# Patient Record
Sex: Male | Born: 1985 | Race: White | Hispanic: No | Marital: Married | Smoking: Never smoker
Health system: Southern US, Community
[De-identification: ages and names within clinical notes are randomized; demographics above are authoritative.]

## PROBLEM LIST (undated history)

## (undated) DIAGNOSIS — F419 Anxiety disorder, unspecified: Secondary | ICD-10-CM

## (undated) DIAGNOSIS — F4310 Post-traumatic stress disorder, unspecified: Secondary | ICD-10-CM

## (undated) HISTORY — PX: WISDOM TOOTH EXTRACTION: SHX21

---
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
## 2011-12-28 ENCOUNTER — Ambulatory Visit (INDEPENDENT_AMBULATORY_CARE_PROVIDER_SITE_OTHER): Payer: Private Health Insurance - Indemnity | Admitting: Family Medicine

## 2011-12-28 VITALS — BP 124/76 | HR 83 | Temp 98.0°F | Resp 17 | Ht 68.5 in | Wt 185.0 lb

## 2011-12-28 DIAGNOSIS — K644 Residual hemorrhoidal skin tags: Secondary | ICD-10-CM

## 2011-12-28 DIAGNOSIS — L501 Idiopathic urticaria: Secondary | ICD-10-CM

## 2011-12-28 DIAGNOSIS — L251 Unspecified contact dermatitis due to drugs in contact with skin: Secondary | ICD-10-CM

## 2011-12-28 DIAGNOSIS — L509 Urticaria, unspecified: Secondary | ICD-10-CM

## 2011-12-28 DIAGNOSIS — L259 Unspecified contact dermatitis, unspecified cause: Secondary | ICD-10-CM

## 2011-12-28 MED ORDER — HYDROCORTISONE 2.5 % RE CREA: TOPICAL_CREAM | Freq: Two times a day (BID) | RECTAL | Status: DC

## 2011-12-28 NOTE — Patient Instructions (Addendum)
Your rash sounds like hives (urticaria), which may be due to contact with some chemical or substance that is causing an allergy. It is less likely due to scabies recurrence. Take zyrtec over the counter - 1 per day, and benadryl once at night. Ok to use cortisone or desonide cream twice per day to affected areas if still itching.  Switch to dye free/hypoallergenic detergent and fabric softener if you use this.  If not improving in next few days, can retreat with the permethrin cream (head to toe - rinse off in 8 hours and wash bedclothes in hot water). If still not improved, we may need to use prednisone, but recheck with a picture of this rash if not improving.  Return to the clinic or go to the nearest emergency room if any of your symptoms worsen or new symptoms occur.

## 2011-12-28 NOTE — Progress Notes (Signed)
  Subjective:    Patient ID: Ryan Hays, male    DOB: [birth year], 26 y.o.   MRN: [id number]  HPI Ryan Hays is a 27 y.o. male Started with rash around waist and upper legs 3 weeks ago, then to back this week.  Few areas on arms, 1 area between fingers.  Itchy and more swollen and noticeable at night. Itch is main symptom.  No new detergents or dermatologic products. Flairs up more after shower - looks like hives.   Dove soap.  New detergent - 1 week ago, usually uses gain.    Wife had scabies about a month or so ago, and he also used cream overnight to be careful.  Desonide, otc hydrocortisone - past 2 weeks, new   Also with hemorrhoid - used rx cream prior but running out. On and off flairs - current one past few days.  Review of Systems  Constitutional: Negative for fever and chills.  Respiratory: Negative for cough, chest tightness and shortness of breath.   Skin: Positive for rash.       Objective:   Physical Exam  Vitals reviewed. Constitutional: He is oriented to person, place, and time. He appears well-developed and well-nourished.  Cardiovascular: Normal rate, regular rhythm, normal heart sounds and intact distal pulses.   Pulmonary/Chest: Effort normal and breath sounds normal. No stridor.  Genitourinary: Rectal exam shows external hemorrhoid.     Neurological: He is alert and oriented to person, place, and time.  Skin: Skin is warm, dry and intact. Rash noted. Rash is urticarial (faint area on R thigh, L forearm, no lesions noted on back.).          1 small erythematous area interdigital on R hand.   Psychiatric: He has a normal mood and affect. His behavior is normal.       Assessment & Plan:  Ryan Hays is a 26 y.o. male 1. Hemorrhoids, external  hydrocortisone (ANUSOL-HC) 2.5 % rectal cream  2. Urticaria    3. Contact dermatitis     Suspected contact derm/uticaria vs recurrence of scabies. Minimal rash currently, but hx of worsening overnight and  after shower typical of urticaria. Held on prednisone based on reported allergy, but unlikely allergic with clarification of history.  See below. rtc precautions.  Hemorrhoid - nonthrombosed. anusol hc bid prn.  Discussed preventive measures - fluids, fiber, etc, and rtc precautions.   Patient Instructions  Your rash sounds like hives (urticaria), which may be due to contact with some chemical or substance that is causing an allergy. It is less likely due to scabies recurrence. Take zyrtec over the counter - 1 per day, and benadryl once at night. Ok to use cortisone or desonide cream twice per day to affected areas if still itching.  Switch to dye free/hypoallergenic detergent and fabric softener if you use this.  If not improving in next few days, can retreat with the permethrin cream (head to toe - rinse off in 8 hours and wash bedclothes in hot water). If still not improved, we may need to use prednisone, but recheck with a picture of this rash if not improving.  Return to the clinic or go to the nearest emergency room if any of your symptoms worsen or new symptoms occur.

## 2012-01-15 ENCOUNTER — Ambulatory Visit (INDEPENDENT_AMBULATORY_CARE_PROVIDER_SITE_OTHER): Payer: Private Health Insurance - Indemnity | Admitting: Family Medicine

## 2012-01-15 VITALS — BP 116/71 | HR 76 | Temp 97.8°F | Resp 18 | Ht 68.5 in | Wt 188.4 lb

## 2012-01-15 DIAGNOSIS — R21 Rash and other nonspecific skin eruption: Secondary | ICD-10-CM

## 2012-01-15 DIAGNOSIS — Z131 Encounter for screening for diabetes mellitus: Secondary | ICD-10-CM

## 2012-01-15 DIAGNOSIS — L299 Pruritus, unspecified: Secondary | ICD-10-CM

## 2012-01-15 LAB — POCT GLYCOSYLATED HEMOGLOBIN (HGB A1C): Hemoglobin A1C: 5.4

## 2012-01-15 MED ORDER — HYDROXYZINE HCL 25 MG PO TABS: 25.0000 mg | ORAL_TABLET | Freq: Every evening | ORAL | Status: DC | PRN

## 2012-01-15 MED ORDER — CLOBETASOL PROPIONATE 0.05 % EX CREA: TOPICAL_CREAM | Freq: Two times a day (BID) | CUTANEOUS | Status: DC

## 2012-01-15 MED ORDER — NYSTATIN-TRIAMCINOLONE 100000-0.1 UNIT/GM-% EX CREA: TOPICAL_CREAM | Freq: Four times a day (QID) | CUTANEOUS | Status: DC

## 2012-01-15 NOTE — Patient Instructions (Signed)
Pruritus  Pruritis is an itch. There are many different problems that can cause an itch. Dry skin is one of the most common causes of itching. Most cases of itching do not require medical attention.  HOME CARE INSTRUCTIONS  Make sure your skin is moistened on a regular basis. A moisturizer that contains petroleum jelly is best for keeping moisture in your skin. If you develop a rash, you may try the following for relief:   Use corticosteroid cream.  Apply cool compresses to the affected areas.  Bathe with Epsom salts or baking soda in the bathwater.  Soak in colloidal oatmeal baths. These are available at your pharmacy.  Apply baking soda paste to the rash. Stir water into baking soda until it reaches a paste-like consistency.  Use an anti-itch lotion.  Take over-the-counter diphenhydramine medicine by mouth as the instructions direct.  Avoid scratching. Scratching may cause the rash to become infected. If itching is very bad, your caregiver may suggest prescription lotions or creams to lessen your symptoms.  Avoid hot showers, which can make itching worse. A cold shower may help with itching as long as you use a moisturizer after the shower. SEEK MEDICAL CARE IF: The itching does not go away after several days. Document Released: 11/16/2010 Document Revised: 05/29/2011 Document Reviewed: 11/16/2010 [employer] Patient Information 2013 [employer].  

## 2012-01-15 NOTE — Progress Notes (Signed)
[facility]:  Office Visit  Chief Complaint:  Chief Complaint  Patient presents with  . Rash    All over body . Started in groin area in September.    HPI: Ryan Hays is a 26 y.o. male who complains of rash around groin (started on groin and now on waste line) and is on inferior part of axilla and also on the flexor area of his arms at elbow jt. Has been here before for rash , he was seen because wife had scabies and he was treated for it as well, then he developed this rash, Dr.Green had asked him to follow specific instructions regarding antihistamine use and also steroid cream, he has not followed directions. The rash per patient itches all day long and he can't sleep..Denies nay new meds, foods, allergies, travels, detergents, soaps. Works at [employer] job. No pets at home. The only thing different is that her Moved in August to a new apartment, all hardwood. Wife was dx with scabies from nursing home in Late August/early Sept, both she and he have been treated with Permetherin. They used the Permetherin and left it on for 20 hrs x 2 doses. The rash started about the same tie after he had the first dose of Permetherin.  Has diabetes in his family. His son has eczema  History reviewed. No pertinent past medical history. History reviewed. No pertinent past surgical history. History   Social History  . Marital Status: Married    Spouse Name: N/A    Number of Children: N/A  . Years of Education: N/A   Social History Main Topics  . Smoking status: Never Smoker   . Smokeless tobacco: None  . Alcohol Use: None  . Drug Use: None  . Sexually Active: None   Other Topics Concern  . None   Social History Narrative  . None   No family history on file. Allergies  Allergen Reactions  . Prednisone Rash    Reviewed - at 26yo - had poison ivy - rash got worse.    Prior to Admission medications   Medication Sig Start Date End Date Taking? Authorizing Provider    clotrimazole (LOTRIMIN) 1 % cream Apply topically 2 (two) times daily.   Yes Historical Provider, MD  hydrocortisone 1 % lotion Apply topically 2 (two) times daily. Buys the OTC Eczema lotion.   Yes Historical Provider, MD  hydrocortisone (ANUSOL-HC) 2.5 % rectal cream Place rectally 2 (two) times daily. 12/28/11   Shade Flood, MD  hydrocortisone cream 1 % Apply topically 2 (two) times daily.    Historical Provider, MD     ROS: The patient denies fevers, chills, night sweats, unintentional weight loss, chest pain, palpitations, wheezing, dyspnea on exertion, nausea, vomiting, abdominal pain, dysuria, hematuria, melena, numbness, weakness, or tingling. All other systems have been reviewed and were otherwise negative with the exception of those mentioned in the HPI and as above.    PHYSICAL EXAM: Filed Vitals:   01/15/12 0854  BP: 116/71  Pulse: 76  Temp: 97.8 F (36.6 C)  Resp: 18   Filed Vitals:   01/15/12 0854  Height: 5' 8.5" (1.74 m)  Weight: 188 lb 6.4 oz (85.458 kg)   Body mass index is 28.23 kg/(m^2).  General: Alert, no acute distress HEENT:  Normocephalic, atraumatic, oropharynx patent.  Cardiovascular:  Regular rate and rhythm, no rubs murmurs or gallops.  No Carotid bruits, radial pulse intact. No pedal edema.  Respiratory: Clear to  auscultation bilaterally.  No wheezes, rales, or rhonchi.  No cyanosis, no use of accessory musculature GI: No organomegaly, abdomen is soft and non-tender, positive bowel sounds.  No masses. Skin:+ small diffuse erythematous papular rash in patches all over body, on flexor areas of arms, on groin area, on stomach, waistline; none along hands or webspaces Neurologic: Facial musculature symmetric. Psychiatric: Patient is appropriate throughout our interaction. Lymphatic: No cervical lymphadenopathy Musculoskeletal: Gait intact.   LABS: Results for orders placed in visit on 01/15/12  POCT GLYCOSYLATED HEMOGLOBIN (HGB A1C)       Component Value Range   Hemoglobin A1C 5.4       EKG/XRAY:   Primary read interpreted by Dr. Conley Rolls at [facility].   ASSESSMENT/PLAN: Encounter Diagnoses  Name Primary?  . Rash and nonspecific skin eruption Yes  . Screening for diabetes mellitus   . Itch    ? Reaction to Permetherin vs atopic dermatitis Rx Clobetasol for lower body rash Rx Mycolog for groin rash Rx Vistaril for itching take qhs and then Zyrtec in the AM. F/u in 2 weeks, if no improvement then will either refer to dermatology or do skin biopsy. Patient preference.    Ryan Blacklock PHUONG, DO 01/15/2012 10:41 AM

## 2012-04-26 ENCOUNTER — Encounter (HOSPITAL_BASED_OUTPATIENT_CLINIC_OR_DEPARTMENT_OTHER): Payer: Self-pay | Admitting: *Deleted

## 2012-04-26 ENCOUNTER — Other Ambulatory Visit: Payer: Self-pay | Admitting: Emergency Medicine

## 2012-04-26 ENCOUNTER — Emergency Department (HOSPITAL_BASED_OUTPATIENT_CLINIC_OR_DEPARTMENT_OTHER)
Admission: EM | Admit: 2012-04-26 | Discharge: 2012-04-26 | Disposition: A | Discharge status: Home / Self Care | Payer: Managed Care, Other (non HMO) | Attending: Emergency Medicine | Admitting: Emergency Medicine

## 2012-04-26 ENCOUNTER — Emergency Department (HOSPITAL_BASED_OUTPATIENT_CLINIC_OR_DEPARTMENT_OTHER): Payer: Managed Care, Other (non HMO)

## 2012-04-26 ENCOUNTER — Ambulatory Visit (INDEPENDENT_AMBULATORY_CARE_PROVIDER_SITE_OTHER): Payer: Private Health Insurance - Indemnity | Admitting: Emergency Medicine

## 2012-04-26 ENCOUNTER — Ambulatory Visit: Payer: Private Health Insurance - Indemnity

## 2012-04-26 VITALS — BP 120/84 | HR 101 | Temp 98.0°F | Resp 16 | Ht 68.0 in | Wt 190.8 lb

## 2012-04-26 DIAGNOSIS — R079 Chest pain, unspecified: Secondary | ICD-10-CM

## 2012-04-26 DIAGNOSIS — R0789 Other chest pain: Secondary | ICD-10-CM

## 2012-04-26 DIAGNOSIS — R071 Chest pain on breathing: Secondary | ICD-10-CM | POA: Insufficient documentation

## 2012-04-26 LAB — POCT CBC
MCH, POC: 30.4 pg (ref 27–31.2)
MID (cbc): 0.9 (ref 0–0.9)
MPV: 9.2 fL (ref 0–99.8)
POC MID %: 9.1 %M (ref 0–12)
Platelet Count, POC: 248 10*3/uL (ref 142–424)
RBC: 4.51 M/uL — AB (ref 4.69–6.13)
WBC: 9.9 10*3/uL (ref 4.6–10.2)

## 2012-04-26 MED ORDER — OXYCODONE-ACETAMINOPHEN 5-325 MG PO TABS: 1.0000 | ORAL_TABLET | ORAL | Status: DC | PRN

## 2012-04-26 MED ORDER — IOHEXOL 350 MG/ML SOLN
100.0000 mL | Freq: Once | INTRAVENOUS | Status: AC | PRN
  Administered 2012-04-26: 100 mL via INTRAVENOUS

## 2012-04-26 MED ORDER — IBUPROFEN 800 MG PO TABS: 800.0000 mg | ORAL_TABLET | Freq: Three times a day (TID) | ORAL | Status: DC

## 2012-04-26 MED ORDER — OXYCODONE-ACETAMINOPHEN 5-325 MG PO TABS
1.0000 | ORAL_TABLET | Freq: Once | ORAL | Status: AC
  Administered 2012-04-26: 1 via ORAL
  Filled 2012-04-26 (×2): qty 1

## 2012-04-26 MED ORDER — KETOROLAC TROMETHAMINE 60 MG/2ML IM SOLN
60.0000 mg | Freq: Once | INTRAMUSCULAR | Status: AC
  Administered 2012-04-26: 60 mg via INTRAMUSCULAR

## 2012-04-26 NOTE — Progress Notes (Signed)
  Subjective:    Patient ID: Ryan Hays, male    DOB: [birth year], 27 y.o.   MRN: [id number]  HPI Patient presents today with a sharp pain in his right side. Patient is guarding right side. Complains of a 7 out of 10 pain scale. Pain began yesterday morning when he woke up. States that he thought he might have slept wrong. The pain has been consistent and getting worse through out the day. He was unable to get much sleep last night. He has also been short of breath. When he takes a breath in it causes the pain to increase. He states no injury that he is aware of. He has not been ill or any cough. No pain in legs. Denies any travel. No swelling or rash. Patient is an Film/video editor.    Review of Systems     Objective:   Physical Exam he was sitting in the room leaning to the side to try and get comfortable due to his chest pain. He has obvious discomfort when he tries to take a deep breath. Breath sounds posterior were symmetrical without rub. When I listened anteriorly I did not hear good breath sounds but he was having difficulty taking a deep breath. Neck exam is unremarkable. Abdomen was soft in the right upper quadrant. There is no definite chest wall tenderness.  [facility] reading (PRIMARY) by  Dr. Cleta Alberts there is poor inspiratory effort. Please comment on the right cardiophrenic angle.  Results for orders placed in visit on 01/15/12  POCT GLYCOSYLATED HEMOGLOBIN (HGB A1C)      Component Value Range   Hemoglobin A1C 5.4     Results for orders placed in visit on 04/26/12  POCT CBC      Component Value Range   WBC 9.9  4.6 - 10.2 K/uL   Lymph, poc 3.0  0.6 - 3.4   POC LYMPH PERCENT 30.1  10 - 50 %L   MID (cbc) 0.9  0 - 0.9   POC MID % 9.1  0 - 12 %M   POC Granulocyte 6.0  2 - 6.9   Granulocyte percent 60.8  37 - 80 %G   RBC 4.51 (*) 4.69 - 6.13 M/uL   Hemoglobin 13.7 (*) 14.1 - 18.1 g/dL   HCT, POC 46.9 (*) 62.9 - 53.7 %   MCV 89.6  80 - 97 fL   MCH, POC 30.4  27 - 31.2 pg   MCHC 33.9   31.8 - 35.4 g/dL   RDW, POC 52.8     Platelet Count, POC 248  142 - 424 K/uL   MPV 9.2  0 - 99.8 fL       Assessment & Plan:  Patient presents with right pleuritic chest pain. Chest x-ray shows no pneumothorax. He was given a shot of Toradol but will need scanning to the hospital. We'll discuss with the patient whether he wants checked into the emergency room which I think would be better patient having any outpatient CT angiogram to rule out PE. 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

## 2012-04-26 NOTE — ED Notes (Signed)
Awakened with right sided rib pain yesterday.  Pain progressively worsened throughout the day.  Today, he c/o increased pain and pain with inspiration. Denies recent fall or injury.  Went to UC this am and they sent pt. here for possible CT Chest.

## 2012-04-26 NOTE — ED Notes (Signed)
Pt reports sharp pain right ribs since waking yesterday am- denies injury- seen at [facility] today and was given IM toradol which has helped- sent here for possible CT scan due to ?cxray results at Urgent Care

## 2012-04-26 NOTE — ED Provider Notes (Signed)
History     CSN: [id number]  Arrival date & time 04/26/12  1039   First MD Initiated Contact with Patient 04/26/12 1151      Chief Complaint  Patient presents with  . Chest Pain    (Consider location/radiation/quality/duration/timing/severity/associated sxs/prior treatment) HPI Comments: Pt presents today for right sided anterior rib pain x 2 days.  States he woke up and felt as though he slept wrong.  Pain is described as constant, sharp, non-radiating, and worse with movement or deep breathing.  Was previously seen at urgent care today where CXR revealed no acute processes. Pt was sent here for further evaluation.  Denies any recent chest or LE injury.  Denies fever, cough, abdominal pain, nausea, vomiting, diarrhea, or LE edema.  No prior episodes of chest pain like this.  No hx of DVT, PE, renal stones, or gallstones.  Pt is not a smoker. Pain does not seem to be related to eating.  Positive family hx for MI.  Pt does not feel SOB at this time.  The history is provided by the patient.    History reviewed. No pertinent past medical history.  History reviewed. No pertinent past surgical history.  No family history on file.  History  Substance Use Topics  . Smoking status: Never Smoker   . Smokeless tobacco: Never Used  . Alcohol Use: 0.6 oz/week    1 Cans of beer per week      Review of Systems  Cardiovascular: Positive for chest pain (right sided).  All other systems reviewed and are negative.    Allergies  Prednisone  Home Medications   Current Outpatient Rx  Name  Route  Sig  Dispense  Refill  . IBUPROFEN 200 MG PO TABS   Oral   Take 400 mg by mouth every 6 (six) hours as needed.         Marland Kitchen CLOBETASOL PROPIONATE 0.05 % EX CREA   Topical   Apply topically 2 (two) times daily. Apply only for 2 weeks at a time   60 g   2   . CLOTRIMAZOLE 1 % EX CREA   Topical   Apply topically 2 (two) times daily.         Marland Kitchen HYDROCORTISONE 2.5 % RE CREA   Rectal  Place rectally 2 (two) times daily.   30 g   0   . HYDROCORTISONE 1 % EX LOTN   Topical   Apply topically 2 (two) times daily. Buys the OTC Eczema lotion.         Marland Kitchen HYDROCORTISONE 1 % EX CREA   Topical   Apply topically 2 (two) times daily.         Marland Kitchen HYDROXYZINE HCL 25 MG PO TABS   Oral   Take 1 tablet (25 mg total) by mouth at bedtime as needed for itching.   30 tablet   0   . NYSTATIN-TRIAMCINOLONE 100000-0.1 UNIT/GM-% EX CREA   Topical   Apply topically 4 (four) times daily. Apply for 2 weeks at a time to groin only   60 g   2     BP 136/92  Pulse 105  Temp 98.4 F (36.9 C) (Oral)  Resp 24  Ht 5\' 10"  (1.778 m)  Wt 190 lb (86.183 kg)  BMI 27.26 kg/m2  SpO2 98%  Physical Exam  Nursing note and vitals reviewed. Constitutional: He is oriented to person, place, and time. He appears well-developed and well-nourished.  HENT:  Head: Normocephalic and atraumatic.  Eyes: Conjunctivae normal and EOM are normal.  Neck: Normal range of motion.  Cardiovascular: Normal rate, regular rhythm and normal heart sounds.   No murmur heard. Pulmonary/Chest: Effort normal and breath sounds normal. No respiratory distress. He has no wheezes. He has no rhonchi. He exhibits tenderness (right anterior ribs). He exhibits no laceration, no deformity and no swelling.  Abdominal: Soft. Bowel sounds are normal. There is no tenderness. There is no CVA tenderness, no tenderness at McBurney's point and negative Murphy's sign.  Musculoskeletal: Normal range of motion. He exhibits no edema.  Neurological: He is alert and oriented to person, place, and time.  Skin: Skin is warm and dry. He is not diaphoretic.  Psychiatric: He has a normal mood and affect.    ED Course  Procedures (including critical care time)  Labs Reviewed - No data to display Dg Chest 2 View  04/26/2012  *RADIOLOGY REPORT*  Clinical Data: History of chest pain.  CHEST - 2 VIEW  Comparison: None.  Findings: Cardiac  silhouette is upper normal size and shape. Density in right epicardial region is felt to most likely represent epicardial fat pad. No definite abnormal opacity seen on lateral examination.  No pulmonary infiltrates or masses were evident. No pleural abnormality is evident. Bones appear average for age.  IMPRESSION: No acute  cardiopulmonary process is evident.  Density in right epicardial region is felt to most likely represent epicardial fat pad. I do not have prior studies for comparison.  CT of the chest could be considered to exclude other possible etiology of this density.   Original Report Authenticated By: Onalee Hua Call    Ct Angio Chest Pe W/cm &/or Wo Cm  04/26/2012  *RADIOLOGY REPORT*  Clinical Data: Lambert Mody right chest pain.  Pain with deep inspiration.  CT ANGIOGRAPHY CHEST  Technique:  Multidetector CT imaging of the chest using the standard protocol during bolus administration of intravenous contrast. Multiplanar reconstructed images including MIPs were obtained and reviewed to evaluate the vascular anatomy.  Contrast: OMNIPAQUE IOHEXOL 350 MG/ML SOLN  Comparison: Chest x-ray dated 04/26/2012  Findings: Pulmonary arterial opacification is suboptimal but I think that the study is diagnostic.  There are no pulmonary emboli, infiltrates, effusions, or other significant abnormalities.  The osseous structures are normal. Heart size is normal.  IMPRESSION: Normal exam.   Original Report Authenticated By: Francene Boyers, M.D.      1. Right-sided chest wall pain       MDM  12:10 PM Pt evaluated.  CXR as noted above.  Recommended CT angio to r/o PE.  Will re-evaluate once resulted.   2:38 PM CT angio chest unremarkable.  Pain is most likely musculoskeletal in nature given rib tenderness and negative Murphy's sign.  Will give pain meds and anti-inflammatories.  Information given about gallbladder/gallstones and encouraged to follow up if begin having these symptoms.  Return to the ED for new or  worsening symptoms.  Garlon Hatchet, PA-C 04/26/12 [phone]

## 2012-04-29 NOTE — ED Provider Notes (Signed)
Medical screening examination/treatment/procedure(s) were performed by non-physician practitioner and as supervising physician I was immediately available for consultation/collaboration.  Ethelda Chick, MD 04/29/12 [phone]

## 2012-04-30 LAB — ANTI-NUCLEAR AB-TITER (ANA TITER): ANA Titer 1: NEGATIVE

## 2012-05-13 ENCOUNTER — Telehealth: Payer: Self-pay

## 2012-05-13 NOTE — Telephone Encounter (Signed)
Pt called [place] back about his labs. Notified.

## 2013-03-04 ENCOUNTER — Telehealth: Payer: Self-pay

## 2013-03-04 ENCOUNTER — Ambulatory Visit (INDEPENDENT_AMBULATORY_CARE_PROVIDER_SITE_OTHER): Payer: BC Managed Care – PPO | Admitting: Family Medicine

## 2013-03-04 ENCOUNTER — Ambulatory Visit: Payer: BC Managed Care – PPO

## 2013-03-04 VITALS — BP 116/82 | HR 124 | Temp 98.3°F | Resp 18 | Ht 69.0 in | Wt 191.0 lb

## 2013-03-04 DIAGNOSIS — R062 Wheezing: Secondary | ICD-10-CM

## 2013-03-04 DIAGNOSIS — J45909 Unspecified asthma, uncomplicated: Secondary | ICD-10-CM

## 2013-03-04 DIAGNOSIS — R05 Cough: Secondary | ICD-10-CM

## 2013-03-04 LAB — POCT CBC
HCT, POC: 48.2 % (ref 43.5–53.7)
Hemoglobin: 15.1 g/dL (ref 14.1–18.1)
Lymph, poc: 3.9 — AB (ref 0.6–3.4)
MCH, POC: 29.1 pg (ref 27–31.2)
MCHC: 31.3 g/dL — AB (ref 31.8–35.4)
WBC: 8.9 10*3/uL (ref 4.6–10.2)

## 2013-03-04 MED ORDER — HYDROCOD POLST-CHLORPHEN POLST 10-8 MG/5ML PO LQCR: 5.0000 mL | Freq: Two times a day (BID) | ORAL | Status: AC

## 2013-03-04 MED ORDER — AZITHROMYCIN 250 MG PO TABS: ORAL_TABLET | ORAL | Status: AC

## 2013-03-04 MED ORDER — MUCINEX DM MAXIMUM STRENGTH 60-1200 MG PO TB12: 1.0000 | ORAL_TABLET | Freq: Two times a day (BID) | ORAL | Status: DC

## 2013-03-04 MED ORDER — ALBUTEROL SULFATE (2.5 MG/3ML) 0.083% IN NEBU
2.5000 mg | INHALATION_SOLUTION | Freq: Once | RESPIRATORY_TRACT | Status: AC
  Administered 2013-03-04: 2.5 mg via RESPIRATORY_TRACT

## 2013-03-04 NOTE — Telephone Encounter (Signed)
What are the questions? Called him he wants to know if he is contagious advised him he was likely contagious at the beginning of his illness. He should use good hand washing techniques

## 2013-03-04 NOTE — Progress Notes (Signed)
Subjective:    Patient ID: Ryan Hays, male    DOB: 16-Nov-1985, 27 y.o.   MRN: [id number]  HPI Pt presents to clinic with 3 week h/o cold symptoms.  He started with congestion that after about a week seemed to get better but then after several days he seemed to get same symptoms back with more chest congestion now.  He has yellow rhinorrhea that is mild and a productive cough with yellow sputum that seems to get thicker as the days goes on.  He does not feel SOB but has heard himself wheezing (he has no h/o asthma but his son does have asthma).    OTC meds -aleve, mucinex (last several days) Review of Systems  Constitutional: Negative for fever and chills.  HENT: Positive for congestion, postnasal drip, rhinorrhea (yellow) and sore throat.   Respiratory: Positive for cough (yellow).   Gastrointestinal: Negative for nausea (at onset up resolved now), vomiting and diarrhea.  Musculoskeletal: Negative for myalgias.  Neurological: Negative for headaches.       Objective:   Physical Exam  Vitals reviewed. Constitutional: He is oriented to person, place, and time. He appears well-developed and well-nourished.  HENT:  Head: Normocephalic and atraumatic.  Right Ear: Hearing, tympanic membrane, external ear and ear canal normal.  Left Ear: Hearing, tympanic membrane, external ear and ear canal normal.  Nose: Mucosal edema (red) present.  Mouth/Throat: Uvula is midline, oropharynx is clear and moist and mucous membranes are normal.  Eyes: Conjunctivae are normal.  Neck: Normal range of motion.  Cardiovascular: Normal rate, regular rhythm and normal heart sounds.   No murmur heard. Pulmonary/Chest: Effort normal. He has wheezes (expiratory wheezing/rhonchi on the L lung and the R lung base - ).  Lymphadenopathy:    He has no cervical adenopathy.  Neurological: He is alert and oriented to person, place, and time.  Skin: Skin is warm and dry.  Psychiatric: He has a normal mood and  affect. His behavior is normal. Judgment and thought content normal.   Pt given an albuterol neb and he felt no better - his pulse Ox went to 98% and his HR increased to 145 and he felt jittery - still had the burning in his chest - there was no change in his auscultatory lung exam  Results for orders placed in visit on 03/04/13  POCT CBC      Result Value Range   WBC 8.9  4.6 - 10.2 K/uL   Lymph, poc 3.9 (*) 0.6 - 3.4   POC LYMPH PERCENT 44.1  10 - 50 %L   MID (cbc) 0.6  0 - 0.9   POC MID % 7.3  0 - 12 %M   POC Granulocyte 4.3  2 - 6.9   Granulocyte percent 48.6  37 - 80 %G   RBC 5.19  4.69 - 6.13 M/uL   Hemoglobin 15.1  14.1 - 18.1 g/dL   HCT, POC 40.9  81.1 - 53.7 %   MCV 92.8  80 - 97 fL   MCH, POC 29.1  27 - 31.2 pg   MCHC 31.3 (*) 31.8 - 35.4 g/dL   RDW, POC 91.4     Platelet Count, POC 262  142 - 424 K/uL   MPV 9.4  0 - 99.8 fL   [facility] reading (PRIMARY) by  Dr. Katrinka Blazing.  No infiltrate.      Assessment & Plan:  Wheezing - Plan: albuterol (PROVENTIL) (2.5 MG/3ML) 0.083% nebulizer solution 2.5 mg, DG  Chest 2 View  Cough - Plan: DG Chest 2 View, POCT CBC, Dextromethorphan-Guaifenesin (MUCINEX DM MAXIMUM STRENGTH) 60-1200 MG TB12, chlorpheniramine-HYDROcodone (TUSSIONEX PENNKINETIC ER) 10-8 MG/5ML LQCR  Asthmatic bronchitis - Plan: azithromycin (ZITHROMAX Z-PAK) 250 MG tablet Acute bronchitis that was not respondent to albuterol - will treat with abx and mucinex and cough meds -  Push fluids - RTC in 48h if no better sooner if worse.  Benny Lennert PA-C 03/04/2013 10:13 AM

## 2013-03-04 NOTE — Telephone Encounter (Signed)
PT STATES HE WAS IN EARLIER AND SAW SARAH WEBER, NOW HAVE A COUPLE OF QUESTIONS FOR HER. PLEASE CALL [phone]

## 2013-03-22 ENCOUNTER — Ambulatory Visit (INDEPENDENT_AMBULATORY_CARE_PROVIDER_SITE_OTHER): Payer: BC Managed Care – PPO | Admitting: Family Medicine

## 2013-03-22 ENCOUNTER — Telehealth: Payer: Self-pay | Admitting: Family Medicine

## 2013-03-22 VITALS — BP 140/90 | HR 82 | Temp 98.0°F | Resp 18 | Ht 68.5 in | Wt 191.0 lb

## 2013-03-22 DIAGNOSIS — R05 Cough: Secondary | ICD-10-CM

## 2013-03-22 DIAGNOSIS — K649 Unspecified hemorrhoids: Secondary | ICD-10-CM

## 2013-03-22 DIAGNOSIS — R059 Cough, unspecified: Secondary | ICD-10-CM

## 2013-03-22 DIAGNOSIS — J4520 Mild intermittent asthma, uncomplicated: Secondary | ICD-10-CM

## 2013-03-22 DIAGNOSIS — R058 Other specified cough: Secondary | ICD-10-CM

## 2013-03-22 DIAGNOSIS — J45909 Unspecified asthma, uncomplicated: Secondary | ICD-10-CM

## 2013-03-22 MED ORDER — PREDNISONE 20 MG PO TABS: ORAL_TABLET | ORAL | Status: DC

## 2013-03-22 MED ORDER — HYDROCORTISONE 2.5 % RE CREA: 1.0000 "application " | TOPICAL_CREAM | Freq: Two times a day (BID) | RECTAL | Status: DC

## 2013-03-22 MED ORDER — CEFDINIR 300 MG PO CAPS: 600.0000 mg | ORAL_CAPSULE | Freq: Every day | ORAL | Status: DC

## 2013-03-22 MED ORDER — ALBUTEROL SULFATE HFA 108 (90 BASE) MCG/ACT IN AERS: INHALATION_SPRAY | RESPIRATORY_TRACT | Status: AC

## 2013-03-22 NOTE — Telephone Encounter (Signed)
Pt returned to clinic with an old rx for anusol HC cream from 2013 per Dr. Neva SeatGreene.  He states he never filled this rx but would like to know if he can still use it for hemorrhoids.  He states he has itching but no bleeding.  I will write a new rx for him today and send to drug store for him.

## 2013-03-22 NOTE — Patient Instructions (Signed)
Drink plenty of fluids  Use the inhaler 2 puffs about 4 times daily initially for a few days, then decrease to just using it as needed  Take the prednisone 3 daily for 2 days, then 2 daily for 2 days, then one daily for 2 days. Best taken in the morning.  [facility] one twice daily  Return again if not improving

## 2013-03-22 NOTE — Progress Notes (Signed)
Subjective: 28 year old man who was treated here for a persisting cough asthmatic bronchitis several weeks ago. Chest x-ray and labs were normal. He was treated with a Z-Pak and has continued to have his symptoms. He coughs a lot, especially in the evenings. He does not smoke. His son has some asthma and cough some. His wife has not been ill at all. 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 He coughs up a little bit of yellowish mucus.  This all started with a cold back around Thanksgiving with his sinuses and head plugged up. No fever.  Objective: Pleasant alert gentleman in no major distress. His TMs are normal. Throat clear. Neck supple without significant nodes. Chest clear to percussion and auscultation. Heart regular without murmurs.  Peak flow 540 with a predicted maximum of 625  Assessment: Asthmatic bronchitis Post viral cough syndrome

## 2013-06-17 ENCOUNTER — Ambulatory Visit (INDEPENDENT_AMBULATORY_CARE_PROVIDER_SITE_OTHER): Payer: BC Managed Care – PPO | Admitting: Internal Medicine

## 2013-06-17 ENCOUNTER — Encounter: Payer: Self-pay | Admitting: Internal Medicine

## 2013-06-17 VITALS — BP 110/78 | HR 119 | Temp 99.5°F | Resp 18 | Ht 69.0 in | Wt 192.0 lb

## 2013-06-17 DIAGNOSIS — N4281 Prostatodynia syndrome: Secondary | ICD-10-CM

## 2013-06-17 DIAGNOSIS — N419 Inflammatory disease of prostate, unspecified: Secondary | ICD-10-CM

## 2013-06-17 DIAGNOSIS — N4289 Other specified disorders of prostate: Secondary | ICD-10-CM

## 2013-06-17 DIAGNOSIS — R5381 Other malaise: Secondary | ICD-10-CM

## 2013-06-17 DIAGNOSIS — R000 Tachycardia, unspecified: Secondary | ICD-10-CM

## 2013-06-17 DIAGNOSIS — R300 Dysuria: Secondary | ICD-10-CM

## 2013-06-17 DIAGNOSIS — R509 Fever, unspecified: Secondary | ICD-10-CM

## 2013-06-17 DIAGNOSIS — R5383 Other fatigue: Secondary | ICD-10-CM

## 2013-06-17 LAB — POCT URINALYSIS DIPSTICK
Glucose, UA: NEGATIVE
Glucose, UA: NEGATIVE
Ketones, UA: NEGATIVE
LEUKOCYTES UA: NEGATIVE
LEUKOCYTES UA: NEGATIVE
NITRITE UA: NEGATIVE
NITRITE UA: POSITIVE
PH UA: 5.5
PROTEIN UA: 100
Protein, UA: 30
Spec Grav, UA: >=1.03
Spec Grav, UA: >=1.03
UROBILINOGEN UA: 0.2
Urobilinogen, UA: 1
pH, UA: 5.5

## 2013-06-17 LAB — COMPREHENSIVE METABOLIC PANEL
ALT: 62 U/L — AB (ref 0–53)
AST: 36 U/L (ref 0–37)
Albumin: 4 g/dL (ref 3.5–5.2)
Alkaline Phosphatase: 89 U/L (ref 39–117)
BUN: 10 mg/dL (ref 6–23)
CALCIUM: 9 mg/dL (ref 8.4–10.5)
CHLORIDE: 101 meq/L (ref 96–112)
CO2: 25 mEq/L (ref 19–32)
CREATININE: 1.09 mg/dL (ref 0.50–1.35)
Glucose, Bld: 92 mg/dL (ref 70–99)
Potassium: 4.3 mEq/L (ref 3.5–5.3)
Sodium: 137 mEq/L (ref 135–145)
Total Bilirubin: 0.7 mg/dL (ref 0.2–1.2)
Total Protein: 7.2 g/dL (ref 6.0–8.3)

## 2013-06-17 LAB — POCT CBC
Granulocyte percent: 54 %G (ref 37–80)
HEMATOCRIT: 42.7 % — AB (ref 43.5–53.7)
Hemoglobin: 13.7 g/dL — AB (ref 14.1–18.1)
LYMPH, POC: 3.2 (ref 0.6–3.4)
MCH, POC: 28.7 pg (ref 27–31.2)
MCHC: 32.1 g/dL (ref 31.8–35.4)
MCV: 89.5 fL (ref 80–97)
MID (cbc): 0.9 (ref 0–0.9)
MPV: 8.9 fL (ref 0–99.8)
POC GRANULOCYTE: 4.8 (ref 2–6.9)
POC LYMPH %: 35.9 % (ref 10–50)
POC MID %: 10.1 %M (ref 0–12)
Platelet Count, POC: 211 10*3/uL (ref 142–424)
RBC: 4.77 M/uL (ref 4.69–6.13)
RDW, POC: 12.5 %
WBC: 8.9 10*3/uL (ref 4.6–10.2)

## 2013-06-17 LAB — POCT UA - MICROSCOPIC ONLY
CRYSTALS, UR, HPF, POC: NEGATIVE
CRYSTALS, UR, HPF, POC: NEGATIVE
Casts, Ur, LPF, POC: NEGATIVE
Casts, Ur, LPF, POC: NEGATIVE
MUCUS UA: POSITIVE
Mucus, UA: POSITIVE
YEAST UA: NEGATIVE
YEAST UA: NEGATIVE

## 2013-06-17 LAB — TSH: TSH: 1.198 u[IU]/mL (ref 0.350–4.500)

## 2013-06-17 MED ORDER — CEFTRIAXONE SODIUM 1 G IJ SOLR
1.0000 g | Freq: Once | INTRAMUSCULAR | Status: AC
  Administered 2013-06-17: 1 g via INTRAMUSCULAR

## 2013-06-17 MED ORDER — CIPROFLOXACIN HCL 500 MG PO TABS: 500.0000 mg | ORAL_TABLET | Freq: Two times a day (BID) | ORAL | Status: DC

## 2013-06-17 NOTE — Patient Instructions (Signed)
Hematuria, Adult Hematuria is blood in your urine. It can be caused by a bladder infection, kidney infection, prostate infection, kidney stone, or cancer of your urinary tract. Infections can usually be treated with medicine, and a kidney stone usually will pass through your urine. If neither of these is the cause of your hematuria, further workup to find out the reason may be needed. It is very important that you tell your health care provider about any blood you see in your urine, even if the blood stops without treatment or happens without causing pain. Blood in your urine that happens and then stops and then happens again can be a symptom of a very serious condition. Also, pain is not a symptom in the initial stages of many urinary cancers. HOME CARE INSTRUCTIONS   Drink lots of fluid, 3 4 quarts a day. If you have been diagnosed with an infection, cranberry juice is especially recommended, in addition to large amounts of water.  Avoid caffeine, tea, and carbonated beverages, because they tend to irritate the bladder.  Avoid alcohol because it may irritate the prostate.  Only take over-the-counter or prescription medicines for pain, discomfort, or fever as directed by your health care provider.  If you have been diagnosed with a kidney stone, follow your health care provider's instructions regarding straining your urine to catch the stone.  Empty your bladder often. Avoid holding urine for long periods of time.  After a bowel movement, women should cleanse front to back. Use each tissue only once.  Empty your bladder before and after sexual intercourse if you are a male. SEEK MEDICAL CARE IF: You develop back pain, fever, a feeling of sickness in your stomach (nausea), or vomiting or if your symptoms are not better in 3 days. Return sooner if you are getting worse. SEEK IMMEDIATE MEDICAL CARE IF:   You have a persistent fever, with a temperature of 101.51F (38.8C) or greater.  You  develop severe vomiting and are unable to keep the medicine down.  You develop severe back or abdominal pain despite taking your medicines.  You begin passing a large amount of blood or clots in your urine.  You feel extremely weak or faint, or you pass out. MAKE SURE YOU:   Understand these instructions.  Will watch your condition.  Will get help right away if you are not doing well or get worse. Document Released: 03/06/2005 Document Revised: 12/25/2012 Document Reviewed: 11/04/2012 [facility] Patient Information 2014 [place]Care, [place]. Prostatitis The prostate gland is about the size and shape of a walnut. It is located just below your bladder. It produces one of the components of semen, which is made up of sperm and the fluids that help nourish and transport it out from the testicles. Prostatitis is inflammation of the prostate gland.  There are four types of prostatitis:  Acute bacterial prostatitis This is the least common type of prostatitis. It starts quickly and usually is associated with a bladder infection, high fever, and shaking chills. It can occur at any age.  Chronic bacterial prostatitis This is a persistent bacterial infection in the prostate. It usually develops from repeated acute bacterial prostatitis or acute bacterial prostatitis that was not properly treated. It can occur in men of any age but is most common in middle-aged men whose prostate has begun to enlarge. The symptoms are not as severe as those in acute bacterial prostatitis. Discomfort in the part of your body that is in front of your rectum and below your  scrotum (perineum), lower abdomen, or in the head of your penis (glans) may represent your primary discomfort.  Chronic prostatitis (nonbacterial) This is the most common type of prostatitis. It is inflammation of the prostate gland that is not caused by a bacterial infection. The cause is unknown and may be associated with a viral infection or autoimmune  disorder.  Prostatodynia (pelvic floor disorder) This is associated with increased muscular tone in the pelvis surrounding the prostate. CAUSES The causes of bacterial prostatitis are bacterial infection. The causes of the other types of prostatitis are unknown.  SYMPTOMS  Symptoms can vary depending upon the type of prostatitis that exists. There can also be overlap in symptoms. Possible symptoms for each type of prostatitis are listed below. Acute Bacterial Prostatitis  Painful urination.  Fever or chills.  Muscle or joint pains.  Low back pain.  Low abdominal pain.  Inability to empty bladder completely. Chronic Bacterial Prostatitis, Chronic Nonbacterial Prostatitis, and Prostatodynia  Sudden urge to urinate.  Frequent urination.  Difficulty starting urine stream.  Weak urine stream.  Discharge from the urethra.  Dribbling after urination.  Rectal pain.  Pain in the testicles, penis, or tip of the penis.  Pain in the perineum.  Problems with sexual function.  Painful ejaculation.  Bloody semen. DIAGNOSIS  In order to diagnose prostatitis, your health care provider will ask about your symptoms. One or more urine samples will be taken and tested (urinalysis). If the urinalysis result is negative for bacteria, your health care provider may use a finger to feel your prostate (digital rectal exam). This exam helps your health care provider determine if your prostate is swollen and tender. It will also produce a specimen of semen that can be analyzed. TREATMENT  Treatment for prostatitis depends on the cause. If a bacterial infection is the cause, it can be treated with antibiotic medicine. In cases of chronic bacterial prostatitis, the use of antibiotics for up to 1 month or 6 weeks may be necessary. Your health care provider may instruct you to take sitz baths to help relieve pain. A sitz bath is a bath of hot water in which your hips and buttocks are under water. This  relaxes the pelvic floor muscles and often helps to relieve the pressure on your prostate. HOME CARE INSTRUCTIONS   Take all medicines as directed by your health care provider.  Take sitz baths as directed by your health care provider. SEEK MEDICAL CARE IF:   Your symptoms get worse, not better.  You have a fever. SEEK IMMEDIATE MEDICAL CARE IF:   You have chills.  You feel nauseous or vomit.  You feel lightheaded or faint.  You are unable to urinate.  You have blood or blood clots in your urine. Document Released: 03/03/2000 Document Revised: 12/25/2012 Document Reviewed: 09/23/2012 [facility] Patient Information 2014 [place], [place].

## 2013-06-17 NOTE — Progress Notes (Signed)
Subjective:    Patient ID: Ryan Hays, male    DOB: 05-Sep-1985, 28 y.o.   MRN: [id number]  HPI Fever, fatigue, sweats, for 1 week. Mild st, mild painful urination. No cough, no congestion, no diarrhea, nausea. Has heat bumps but no rash seen other wise. No ha or disorientation. Not exposed to ticks, no travel hx. He does ride bikes   Review of Systems     Objective:   Physical Exam  Vitals reviewed. Constitutional: He is oriented to person, place, and time. He appears well-developed and well-nourished. No distress.  HENT:  Head: Normocephalic.  Right Ear: External ear normal.  Left Ear: External ear normal.  Nose: Nose normal.  Mouth/Throat: Oropharynx is clear and moist.  Eyes: Conjunctivae and EOM are normal. Pupils are equal, round, and reactive to light. No scleral icterus.  Neck: Normal range of motion. Neck supple. No tracheal deviation present. No thyromegaly present.  Cardiovascular: Regular rhythm.  Tachycardia present.  Exam reveals no gallop.   No murmur heard. Pulmonary/Chest: Effort normal and breath sounds normal.  Abdominal: Soft. Normal appearance and bowel sounds are normal. He exhibits no mass. There is no hepatosplenomegaly. There is no tenderness. There is no CVA tenderness.  Genitourinary: Testes normal and penis normal. Circumcised.  Musculoskeletal: Normal range of motion.  Lymphadenopathy:    He has no cervical adenopathy.       Right: No inguinal adenopathy present.       Left: No inguinal adenopathy present.  Neurological: He is alert and oriented to person, place, and time. No cranial nerve deficit. He exhibits normal muscle tone. Coordination normal.  Skin: Rash noted.  Psychiatric: He has a normal mood and affect. His behavior is normal. Judgment and thought content normal.   Prostate not enlarged but very tender Results for orders placed in visit on 06/17/13  POCT CBC      Result Value Ref Range   WBC 8.9  4.6 - 10.2 K/uL   Lymph,  poc 3.2  0.6 - 3.4   POC LYMPH PERCENT 35.9  10 - 50 %L   MID (cbc) 0.9  0 - 0.9   POC MID % 10.1  0 - 12 %M   POC Granulocyte 4.8  2 - 6.9   Granulocyte percent 54.0  37 - 80 %G   RBC 4.77  4.69 - 6.13 M/uL   Hemoglobin 13.7 (*) 14.1 - 18.1 g/dL   HCT, POC 78.4 (*) 69.6 - 53.7 %   MCV 89.5  80 - 97 fL   MCH, POC 28.7  27 - 31.2 pg   MCHC 32.1  31.8 - 35.4 g/dL   RDW, POC 29.5     Platelet Count, POC 211  142 - 424 K/uL   MPV 8.9  0 - 99.8 fL  POCT UA - MICROSCOPIC ONLY      Result Value Ref Range   WBC, Ur, HPF, POC 0-1     RBC, urine, microscopic 12-20     Bacteria, U Microscopic trace     Mucus, UA positive     Epithelial cells, urine per micros 0-1     Crystals, Ur, HPF, POC neg     Casts, Ur, LPF, POC neg     Yeast, UA neg    POCT URINALYSIS DIPSTICK      Result Value Ref Range   Color, UA amber     Clarity, UA clear     Glucose, UA neg  Bilirubin, UA small     Ketones, UA trace     Spec Grav, UA >=1.030     Blood, UA moderate     pH, UA 5.5     Protein, UA 100     Urobilinogen, UA 1.0     Nitrite, UA positive     Leukocytes, UA Negative    POCT UA - MICROSCOPIC ONLY      Result Value Ref Range   WBC, Ur, HPF, POC 0-1     RBC, urine, microscopic 3-8     Bacteria, U Microscopic trace     Mucus, UA positive     Epithelial cells, urine per micros 0-1     Crystals, Ur, HPF, POC neg     Casts, Ur, LPF, POC neg     Yeast, UA neg    POCT URINALYSIS DIPSTICK      Result Value Ref Range   Color, UA amber     Clarity, UA clear     Glucose, UA neg     Bilirubin, UA small     Ketones, UA neg     Spec Grav, UA >=1.030     Blood, UA moderate     pH, UA 5.5     Protein, UA 30     Urobilinogen, UA 0.2     Nitrite, UA neg     Leukocytes, UA Negative     CS urine      Assessment & Plan:  Fever/Sweats /Dysuria/1 week Rocephin 1g/Cipro 500mg  Probable prostatitis RTC 2-3 d

## 2013-06-18 LAB — EPSTEIN-BARR VIRUS VCA ANTIBODY PANEL
EBV EA IgG: <5 U/mL (ref <9.0)
EBV NA IgG: 579 U/mL — ABNORMAL HIGH (ref <18.0)
EBV VCA IGG: 128 U/mL — AB (ref <18.0)
EBV VCA IgM: 11.2 U/mL (ref <36.0)

## 2013-06-18 LAB — URINE CULTURE
COLONY COUNT: NO GROWTH
Organism ID, Bacteria: NO GROWTH

## 2013-06-19 LAB — ROCKY MTN SPOTTED FVR AB, IGM-BLOOD: ROCKY MTN SPOTTED FEVER, IGM: 0.55 IV

## 2013-06-20 ENCOUNTER — Ambulatory Visit: Payer: BC Managed Care – PPO

## 2013-06-20 ENCOUNTER — Ambulatory Visit
Admission: RE | Admit: 2013-06-20 | Discharge: 2013-06-20 | Disposition: A | Discharge status: Home / Self Care | Payer: BC Managed Care – PPO | Source: Ambulatory Visit | Attending: Internal Medicine | Admitting: Internal Medicine

## 2013-06-20 ENCOUNTER — Other Ambulatory Visit: Payer: Self-pay | Admitting: Internal Medicine

## 2013-06-20 ENCOUNTER — Ambulatory Visit (INDEPENDENT_AMBULATORY_CARE_PROVIDER_SITE_OTHER): Payer: BC Managed Care – PPO | Admitting: Internal Medicine

## 2013-06-20 VITALS — BP 110/68 | HR 114 | Temp 99.4°F | Resp 18 | Ht 70.0 in | Wt 192.0 lb

## 2013-06-20 DIAGNOSIS — R319 Hematuria, unspecified: Secondary | ICD-10-CM

## 2013-06-20 DIAGNOSIS — R059 Cough, unspecified: Secondary | ICD-10-CM

## 2013-06-20 DIAGNOSIS — R1012 Left upper quadrant pain: Secondary | ICD-10-CM

## 2013-06-20 DIAGNOSIS — R945 Abnormal results of liver function studies: Secondary | ICD-10-CM

## 2013-06-20 DIAGNOSIS — R7989 Other specified abnormal findings of blood chemistry: Secondary | ICD-10-CM

## 2013-06-20 DIAGNOSIS — R05 Cough: Secondary | ICD-10-CM

## 2013-06-20 DIAGNOSIS — R5381 Other malaise: Secondary | ICD-10-CM

## 2013-06-20 DIAGNOSIS — I498 Other specified cardiac arrhythmias: Secondary | ICD-10-CM

## 2013-06-20 DIAGNOSIS — R509 Fever, unspecified: Secondary | ICD-10-CM

## 2013-06-20 DIAGNOSIS — R5383 Other fatigue: Secondary | ICD-10-CM

## 2013-06-20 DIAGNOSIS — N419 Inflammatory disease of prostate, unspecified: Secondary | ICD-10-CM

## 2013-06-20 DIAGNOSIS — R110 Nausea: Secondary | ICD-10-CM

## 2013-06-20 LAB — POCT URINALYSIS DIPSTICK
Bilirubin, UA: NEGATIVE
Glucose, UA: NEGATIVE
KETONES UA: NEGATIVE
Leukocytes, UA: NEGATIVE
Nitrite, UA: NEGATIVE
PROTEIN UA: 30
Urobilinogen, UA: 0.2
pH, UA: 6

## 2013-06-20 LAB — POCT UA - MICROSCOPIC ONLY
Casts, Ur, LPF, POC: NEGATIVE
Crystals, Ur, HPF, POC: NEGATIVE
YEAST UA: NEGATIVE

## 2013-06-20 LAB — POCT CBC
GRANULOCYTE PERCENT: 49.3 % (ref 37–80)
HEMATOCRIT: 45.7 % (ref 43.5–53.7)
Hemoglobin: 14.6 g/dL (ref 14.1–18.1)
LYMPH, POC: 3.4 (ref 0.6–3.4)
MCH, POC: 28.7 pg (ref 27–31.2)
MCHC: 31.9 g/dL (ref 31.8–35.4)
MCV: 89.9 fL (ref 80–97)
MID (cbc): 0.9 (ref 0–0.9)
MPV: 9.5 fL (ref 0–99.8)
POC Granulocyte: 4.2 (ref 2–6.9)
POC LYMPH %: 39.7 % (ref 10–50)
POC MID %: 11 %M (ref 0–12)
Platelet Count, POC: 229 10*3/uL (ref 142–424)
RBC: 5.08 M/uL (ref 4.69–6.13)
RDW, POC: 12.9 %
WBC: 8.5 10*3/uL (ref 4.6–10.2)

## 2013-06-20 LAB — HEPATIC FUNCTION PANEL
ALBUMIN: 3.9 g/dL (ref 3.5–5.2)
ALT: 61 U/L — ABNORMAL HIGH (ref 0–53)
AST: 45 U/L — AB (ref 0–37)
Alkaline Phosphatase: 95 U/L (ref 39–117)
Bilirubin, Direct: 0.1 mg/dL (ref 0.0–0.3)
Indirect Bilirubin: 0.5 mg/dL (ref 0.2–1.2)
TOTAL PROTEIN: 7.3 g/dL (ref 6.0–8.3)
Total Bilirubin: 0.6 mg/dL (ref 0.2–1.2)

## 2013-06-20 MED ORDER — DOXYCYCLINE HYCLATE 100 MG PO TABS: 100.0000 mg | ORAL_TABLET | Freq: Two times a day (BID) | ORAL | Status: DC

## 2013-06-20 MED ORDER — CEFTRIAXONE SODIUM 1 G IJ SOLR
1.0000 g | Freq: Once | INTRAMUSCULAR | Status: AC
  Administered 2013-06-20: 1 g via INTRAMUSCULAR

## 2013-06-20 NOTE — Progress Notes (Signed)
Subjective:    Patient ID: Ryan Hays, male    DOB: [birth year], 28 y.o.   MRN: [id number]  HPI Pt is here for a follow up, he was here 3 days ago with dysuria, fever, chills and anorexia. He was prescribed cipro with some relief. However fever has not resolve he still haves a fever of 101 at night and during the day runs around 99 his urine is still dark. However no growth in the urine culture.  He sees less dark urine. He has low grade cough. He tolerates cipro. RMSF and ebv tests normal  Review of Systems     Objective:   Physical Exam  Constitutional: He is oriented to person, place, and time. He appears well-developed and well-nourished. No distress.  HENT:  Head: Normocephalic.  Right Ear: External ear normal.  Left Ear: External ear normal.  Nose: Nose normal.  Eyes: Conjunctivae and EOM are normal. Pupils are equal, round, and reactive to light. Right eye exhibits no discharge. Left eye exhibits no discharge. No scleral icterus.  Neck: Normal range of motion. Neck supple.  Cardiovascular: Regular rhythm and normal heart sounds.  Tachycardia present.  Exam reveals no gallop and no friction rub.   No murmur heard. Pulmonary/Chest: Effort normal and breath sounds normal.  Abdominal: Soft. Bowel sounds are normal. There is tenderness in the left upper quadrant. There is CVA tenderness. There is no rigidity, no rebound and no guarding.    Musculoskeletal: Normal range of motion.  Neurological: He is alert and oriented to person, place, and time. No cranial nerve deficit. He exhibits normal muscle tone. Coordination normal.  Skin: No rash noted. He is diaphoretic.  Psychiatric: He has a normal mood and affect. His behavior is normal. Judgment and thought content normal.     [facility] reading (PRIMARY) by  Dr Perrin MalteseGuest normal cxr   Results for orders placed in visit on 06/20/13  POCT CBC      Result Value Ref Range   WBC 8.5  4.6 - 10.2 K/uL   Lymph, poc 3.4  0.6 - 3.4   POC LYMPH PERCENT 39.7  10 - 50 %L   MID (cbc) 0.9  0 - 0.9   POC MID % 11.0  0 - 12 %M   POC Granulocyte 4.2  2 - 6.9   Granulocyte percent 49.3  37 - 80 %G   RBC 5.08  4.69 - 6.13 M/uL   Hemoglobin 14.6  14.1 - 18.1 g/dL   HCT, POC 56.245.7  13.043.5 - 53.7 %   MCV 89.9  80 - 97 fL   MCH, POC 28.7  27 - 31.2 pg   MCHC 31.9  31.8 - 35.4 g/dL   RDW, POC 86.512.9     Platelet Count, POC 229  142 - 424 K/uL   MPV 9.5  0 - 99.8 fL  POCT URINALYSIS DIPSTICK      Result Value Ref Range   Color, UA amber     Clarity, UA clear     Glucose, UA neg     Bilirubin, UA neg     Ketones, UA neg     Spec Grav, UA >=1.030     Blood, UA small     pH, UA 6.0     Protein, UA 30     Urobilinogen, UA 0.2     Nitrite, UA neg     Leukocytes, UA Negative    POCT UA - MICROSCOPIC ONLY  Result Value Ref Range   WBC, Ur, HPF, POC 0-2     RBC, urine, microscopic 2-5     Bacteria, U Microscopic trace     Mucus, UA large     Epithelial cells, urine per micros 0-1     Crystals, Ur, HPF, POC neg     Casts, Ur, LPF, POC neg     Yeast, UA neg     Throat few petechiae on phaarynx    Assessment & Plan:  Rocephin IM Add doxycycline 100mg  bid cover occult tick disease/continue cipro RTC Monday am Schedule US kidneys abdomen

## 2013-06-20 NOTE — Patient Instructions (Addendum)
Fever, Adult A fever is a higher than normal body temperature. In an adult, an oral temperature around 98.6 F (37 C) is considered normal. A temperature of 100.4 F (38 C) or higher is generally considered a fever. Mild or moderate fevers generally have no long-term effects and often do not require treatment. Extreme fever (greater than or equal to 106 F or 41.1 C) can cause seizures. The sweating that may occur with repeated or prolonged fever may cause dehydration. Elderly people can develop confusion during a fever. A measured temperature can vary with:  Age.  Time of day.  Method of measurement (mouth, underarm, rectal, or ear). The fever is confirmed by taking a temperature with a thermometer. Temperatures can be taken different ways. Some methods are accurate and some are not.  An oral temperature is used most commonly. Electronic thermometers are fast and accurate.  An ear temperature will only be accurate if the thermometer is positioned as recommended by the manufacturer.  A rectal temperature is accurate and done for those adults who have a condition where an oral temperature cannot be taken.  An underarm (axillary) temperature is not accurate and not recommended. Fever is a symptom, not a disease.  CAUSES   Infections commonly cause fever.  Some noninfectious causes for fever include:  Some arthritis conditions.  Some thyroid or adrenal gland conditions.  Some immune system conditions.  Some types of cancer.  A medicine reaction.  High doses of certain street drugs such as methamphetamine.  Dehydration.  Exposure to high outside or room temperatures.  Occasionally, the source of a fever cannot be determined. This is sometimes called a "fever of unknown origin" (FUO).  Some situations may lead to a temporary rise in body temperature that may go away on its own. Examples are:  Childbirth.  Surgery.  Intense exercise. HOME CARE INSTRUCTIONS   Take  appropriate medicines for fever. Follow dosing instructions carefully. If you use acetaminophen to reduce the fever, be careful to avoid taking other medicines that also contain acetaminophen. Do not take aspirin for a fever if you are younger than age 29. There is an association with Reye's syndrome. Reye's syndrome is a rare but potentially deadly disease.  If an infection is present and antibiotics have been prescribed, take them as directed. Finish them even if you start to feel better.  Rest as needed.  Maintain an adequate fluid intake. To prevent dehydration during an illness with prolonged or recurrent fever, you may need to drink extra fluid.Drink enough fluids to keep your urine clear or pale yellow.  Sponging or bathing with room temperature water may help reduce body temperature. Do not use ice water or alcohol sponge baths.  Dress comfortably, but do not over-bundle. SEEK MEDICAL CARE IF:   You are unable to keep fluids down.  You develop vomiting or diarrhea.  You are not feeling at least partly better after 3 days.  You develop new symptoms or problems. SEEK IMMEDIATE MEDICAL CARE IF:   You have shortness of breath or trouble breathing.  You develop excessive weakness.  You are dizzy or you faint.  You are extremely thirsty or you are making little or no urine.  You develop new pain that was not there before (such as in the head, neck, chest, back, or abdomen).  You have persistant vomiting and diarrhea for more than 1 to 2 days.  You develop a stiff neck or your eyes become sensitive to light.  You develop a   skin rash.  You have a fever or persistent symptoms for more than 2 to 3 days.  You have a fever and your symptoms suddenly get worse. MAKE SURE YOU:   Understand these instructions.  Will watch your condition.  Will get help right away if you are not doing well or get worse. Document Released: 08/30/2000 Document Revised: 05/29/2011 Document  Reviewed: 01/05/2011 [facility] Patient Information 2014 [place]ExitCare, [place]. Flank Pain Flank pain refers to pain that is located on the side of the body between the upper abdomen and the back. The pain may occur over a short period of time (acute) or may be long-term or reoccurring (chronic). It may be mild or severe. Flank pain can be caused by many things. CAUSES  Some of the more common causes of flank pain include:  Muscle strains.   Muscle spasms.   A disease of your spine (vertebral disk disease).   A lung infection (pneumonia).   Fluid around your lungs (pulmonary edema).   A kidney infection.   Kidney stones.   A very painful skin rash caused by the chickenpox virus (shingles).   Gallbladder disease.  HOME CARE INSTRUCTIONS  Home care will depend on the cause of your pain. In general,  Rest as directed by your caregiver.  Drink enough fluids to keep your urine clear or pale yellow.  Only take over-the-counter or prescription medicines as directed by your caregiver. Some medicines may help relieve the pain.  Tell your caregiver about any changes in your pain.  Follow up with your caregiver as directed. SEEK IMMEDIATE MEDICAL CARE IF:   Your pain is not controlled with medicine.   You have new or worsening symptoms.  Your pain increases.   You have abdominal pain.   You have shortness of breath.   You have persistent nausea or vomiting.   You have swelling in your abdomen.   You feel faint or pass out.   You have blood in your urine.  You have a fever or persistent symptoms for more than 2 3 days.  You have a fever and your symptoms suddenly get worse. MAKE SURE YOU:   Understand these instructions.  Will watch your condition.  Will get help right away if you are not doing well or get worse. Document Released: 04/27/2005 Document Revised: 11/29/2011 Document Reviewed: 10/19/2011 [facility] Patient Information 2014 [place]Exit[place], [place]LL[place].

## 2013-06-21 ENCOUNTER — Encounter: Payer: Self-pay | Admitting: *Deleted

## 2013-06-21 LAB — LIPASE: LIPASE: 23 U/L (ref 0–75)

## 2013-06-23 ENCOUNTER — Ambulatory Visit (INDEPENDENT_AMBULATORY_CARE_PROVIDER_SITE_OTHER): Payer: BC Managed Care – PPO | Admitting: Internal Medicine

## 2013-06-23 VITALS — BP 122/74 | HR 111 | Temp 98.5°F | Resp 17 | Ht 69.0 in | Wt 192.0 lb

## 2013-06-23 DIAGNOSIS — R110 Nausea: Secondary | ICD-10-CM

## 2013-06-23 DIAGNOSIS — I498 Other specified cardiac arrhythmias: Secondary | ICD-10-CM

## 2013-06-23 DIAGNOSIS — R319 Hematuria, unspecified: Secondary | ICD-10-CM

## 2013-06-23 DIAGNOSIS — R5381 Other malaise: Secondary | ICD-10-CM

## 2013-06-23 DIAGNOSIS — R000 Tachycardia, unspecified: Secondary | ICD-10-CM

## 2013-06-23 DIAGNOSIS — N419 Inflammatory disease of prostate, unspecified: Secondary | ICD-10-CM

## 2013-06-23 DIAGNOSIS — R5383 Other fatigue: Secondary | ICD-10-CM

## 2013-06-23 DIAGNOSIS — R509 Fever, unspecified: Secondary | ICD-10-CM

## 2013-06-23 LAB — HEPATIC FUNCTION PANEL
ALBUMIN: 3.9 g/dL (ref 3.5–5.2)
ALK PHOS: 85 U/L (ref 39–117)
ALT: 111 U/L — AB (ref 0–53)
AST: 93 U/L — AB (ref 0–37)
BILIRUBIN INDIRECT: 0.4 mg/dL (ref 0.2–1.2)
Bilirubin, Direct: 0.1 mg/dL (ref 0.0–0.3)
TOTAL PROTEIN: 7 g/dL (ref 6.0–8.3)
Total Bilirubin: 0.5 mg/dL (ref 0.2–1.2)

## 2013-06-23 LAB — POCT URINALYSIS DIPSTICK
Bilirubin, UA: NEGATIVE
GLUCOSE UA: NEGATIVE
Ketones, UA: NEGATIVE
Leukocytes, UA: NEGATIVE
NITRITE UA: NEGATIVE
PROTEIN UA: 30
UROBILINOGEN UA: 0.2
pH, UA: 6

## 2013-06-23 LAB — POCT UA - MICROSCOPIC ONLY
BACTERIA, U MICROSCOPIC: NEGATIVE
CRYSTALS, UR, HPF, POC: NEGATIVE
Casts, Ur, LPF, POC: NEGATIVE
Epithelial cells, urine per micros: NEGATIVE
MUCUS UA: POSITIVE
YEAST UA: NEGATIVE

## 2013-06-23 LAB — ACUTE HEP PANEL AND HEP B SURFACE AB
HCV Ab: NEGATIVE
Hep A IgM: NONREACTIVE
Hep B C IgM: NONREACTIVE
Hep B S Ab: NEGATIVE
Hepatitis B Surface Ag: NEGATIVE

## 2013-06-23 LAB — IFOBT (OCCULT BLOOD): IMMUNOLOGICAL FECAL OCCULT BLOOD TEST: NEGATIVE

## 2013-06-23 MED ORDER — CEFTRIAXONE SODIUM 1 G IJ SOLR
1.0000 g | Freq: Once | INTRAMUSCULAR | Status: AC
  Administered 2013-06-23: 1 g via INTRAMUSCULAR

## 2013-06-23 NOTE — Patient Instructions (Signed)
Liver Profile A liver profile is a battery of tests which helps your caregiver evaluate your liver function. The following tests are often included in the liver profile: Alanine aminotransferase (ALT or SGPT) This is an enzyme found primarily in the liver. Abnormalities may represent liver disease. This is found in cells of the liver so when it is elevated, it has been released by damaged cells. Albumin  The serum albumin is one of the major proteins in the blood and a reflection of the general state of nutrition. This is low when the liver is unable to do its job. It is also low when protein is lost in the urine. NORMAL FINDINGS Adult/Elderly  Total protein: 6.4-8.3 g/dL or 57-8464-83 g/L (SI units)  Albumin: 3.5-5 g/dL or 69-6235-50 g/L (SI units)  Globulin: 2.3-3.4 g/dL  Alpha1 globulin: 9.5-2.80.1-0.3 g/dL or 1-3 g/L (SI units)  Alpha2 globulin: 0.6-1 g/dL or 4-136-10 g/L (SI units)  Beta globulin: 0.7-1.1 g/dL or 2-447-11 g/L (SI units) Children  Total protein  Premature infant: 4.2-7.6 g/dL  Newborn: 0.1-0.24.6-7.4 g/dL  Infant: 7-2.56-6.7 g/dL  Child: 3.6-66.2-8 g/dL  Albumin  Premature infant: 3-4.2 g/dL  Newborn: 4.4-0.33.5-5.4 g/dL  Infant: 4.7-4.24.4-5.4 g/dL  Child: 5-9.54-5.9 g/dL Albumin/Globulin ratio - Calculated by dividing the albumin by the globulin. It is a measure of well being.  Alkaline phosphatase  This is an enzyme which is important in diagnosing proper bone and liver functions. NORMAL FINDINGS Age / Normal Value (units/L)  0-5 days / 35-140  Less than 3 yr / 15-60  3-6 yr / 15-50  6-12 yr / 10-50  12-18 yr / 10-40  Adult / 0-35 units/L or 0-0.58 microKat/L (SI Units) (Females tend to have slightly lower levels than males)  Elderly / Slightly higher than adults Aspartate aminotransferase (AST or SGOT) - an enzyme found in skeletal and heart muscle, liver and other organs. Abnormalities may represent liver disease. This is found in cells of the liver so when it is elevated, it has been released  by damaged cells. Bilirubin, Total: A chemical involved with liver functions. High concentrations may result in jaundice. Jaundice is a yellowing of the skin and the whites of the eyes. NORMAL FINDINGS Blood  Adult/elderly/child  Total bilirubin: 0.3-1.0 mg/dL or 6.3-875.1-17 micromole/L (SI units)  Indirect bilirubin: 0.2-0.8 mg/dL or 5.6-43.33.4-12.0 micromole/L (SI units)  Direct bilirubin: 0.1-0.3 mg/dL or 2.9-5.11.7-5.1 micromole/L (SI units)  Newborn total bilirubin: 1.0-12.0 mg/dL or 88.4-16617.1-205 micromole/L (SI units)  Urine0-0.02 mg/dL Ranges for normal findings may vary among different laboratories and hospitals. You should always check with your doctor after having lab work or other tests done to discuss the meaning of your test results and whether your values are considered within normal limits PREPARATION FOR TEST No preparation or fasting is necessary unless you have been informed otherwise. A blood sample is obtained by inserting a needle into a vein in the arm. MEANING OF TEST  Your caregiver will go over the test results with you and discuss the importance and meaning of your results, as well as treatment options and the need for additional tests if necessary. OBTAINING THE TEST RESULTS It is your responsibility to obtain your test results. Ask the lab or department performing the test when and how you will get your results. Document Released: 04/08/2004 Document Revised: 05/29/2011 Document Reviewed: 02/15/2008 [facility] Patient Information 2014 [place]ExitCare, [place]LL[place].

## 2013-06-23 NOTE — Progress Notes (Signed)
Subjective:    Patient ID: Ryan Hays, male    DOB: 1985-10-12, 28 y.o.   MRN: [id number]  HPI 28 year old male here for a follow up from an infected prostate. He states he has no new symptoms except a little nausea and fatigue which he thinks is from the medication. He has no more fever or chills. Symptoms are much milder than before.  Feels 70% well, still nausea, fatigue, and lmq ache. No pain with walking, no fever, urine clear.  Lipase normal. OT and pt mild elevation. No sob, cp. Tolerates doxycycline, ciprofloxin  [place] suggested mild cholycystitis but no murphys sign, see report. Review of Systems     Objective:   Physical Exam  Constitutional: He is oriented to person, place, and time. He appears well-developed and well-nourished. No distress.  HENT:  Head: Normocephalic.  Nose: Nose normal.  Mouth/Throat: Oropharynx is clear and moist.  Eyes: Conjunctivae and EOM are normal. Pupils are equal, round, and reactive to light. No scleral icterus.  Neck: Normal range of motion. Neck supple.  Cardiovascular: Regular rhythm.   No extrasystoles are present. Tachycardia present.  Exam reveals no gallop, no S3, no S4, no distant heart sounds and no friction rub.   No murmur heard. Pulmonary/Chest: Not tachypneic. He has no decreased breath sounds. He has no wheezes. He has no rhonchi.  Abdominal: Soft. Bowel sounds are normal. There is no hepatosplenomegaly. There is tenderness in the left lower quadrant. There is no rigidity, no rebound, no guarding, no CVA tenderness, no tenderness at McBurney's point and negative Murphy's sign.  Genitourinary: Testes normal and penis normal. Rectal exam shows tenderness. Rectal exam shows no mass and anal tone normal. Prostate is tender. Circumcised.  Musculoskeletal: Normal range of motion. He exhibits no tenderness.  Lymphadenopathy:    He has no cervical adenopathy.  Neurological: He is alert and oriented to person, place, and time. He  exhibits normal muscle tone. Coordination normal.  Skin: No rash noted. He is not diaphoretic.  Psychiatric: He has a normal mood and affect. His behavior is normal. Judgment and thought content normal.   EKG normal, rate 88 Results for orders placed in visit on 06/20/13  HEPATIC FUNCTION PANEL      Result Value Ref Range   Total Bilirubin 0.6  0.2 - 1.2 mg/dL   Bilirubin, Direct 0.1  0.0 - 0.3 mg/dL   Indirect Bilirubin 0.5  0.2 - 1.2 mg/dL   Alkaline Phosphatase 95  39 - 117 U/L   AST 45 (*) 0 - 37 U/L   ALT 61 (*) 0 - 53 U/L   Total Protein 7.3  6.0 - 8.3 g/dL   Albumin 3.9  3.5 - 5.2 g/dL  POCT CBC      Result Value Ref Range   WBC 8.5  4.6 - 10.2 K/uL   Lymph, poc 3.4  0.6 - 3.4   POC LYMPH PERCENT 39.7  10 - 50 %L   MID (cbc) 0.9  0 - 0.9   POC MID % 11.0  0 - 12 %M   POC Granulocyte 4.2  2 - 6.9   Granulocyte percent 49.3  37 - 80 %G   RBC 5.08  4.69 - 6.13 M/uL   Hemoglobin 14.6  14.1 - 18.1 g/dL   HCT, POC 78.2  95.6 - 53.7 %   MCV 89.9  80 - 97 fL   MCH, POC 28.7  27 - 31.2 pg   MCHC 31.9  31.8 - 35.4 g/dL   RDW, POC 81.112.9     Platelet Count, POC 229  142 - 424 K/uL   MPV 9.5  0 - 99.8 fL  POCT URINALYSIS DIPSTICK      Result Value Ref Range   Color, UA amber     Clarity, UA clear     Glucose, UA neg     Bilirubin, UA neg     Ketones, UA neg     Spec Grav, UA >=1.030     Blood, UA small     pH, UA 6.0     Protein, UA 30     Urobilinogen, UA 0.2     Nitrite, UA neg     Leukocytes, UA Negative    POCT UA - MICROSCOPIC ONLY      Result Value Ref Range   WBC, Ur, HPF, POC 0-2     RBC, urine, microscopic 2-5     Bacteria, U Microscopic trace     Mucus, UA large     Epithelial cells, urine per micros 0-1     Crystals, Ur, HPF, POC neg     Casts, Ur, LPF, POC neg     Yeast, UA neg     Results for orders placed in visit on 06/23/13  IFOBT (OCCULT BLOOD)      Result Value Ref Range   IFOBT Negative    POCT UA - MICROSCOPIC ONLY      Result Value Ref  Range   WBC, Ur, HPF, POC 0-2     RBC, urine, microscopic 0-3     Bacteria, U Microscopic neg     Mucus, UA pos     Epithelial cells, urine per micros neg     Crystals, Ur, HPF, POC neg     Casts, Ur, LPF, POC neg     Yeast, UA neg    POCT URINALYSIS DIPSTICK      Result Value Ref Range   Color, UA yellow     Clarity, UA clear     Glucose, UA neg     Bilirubin, UA neg     Ketones, UA neg     Spec Grav, UA >=1.030     Blood, UA trace     pH, UA 6.0     Protein, UA 30     Urobilinogen, UA 0.2     Nitrite, UA neg     Leukocytes, UA Negative      Urine clearer      Assessment & Plan:  Fever resolved/Less fatigue/Tachycardia better Prostate very tender Rocephin 1g/Finish orals RTC Friday

## 2013-06-24 LAB — URINE CULTURE
Colony Count: NO GROWTH
Organism ID, Bacteria: NO GROWTH

## 2013-06-28 ENCOUNTER — Encounter: Payer: Self-pay | Admitting: Radiology

## 2013-07-02 ENCOUNTER — Ambulatory Visit (INDEPENDENT_AMBULATORY_CARE_PROVIDER_SITE_OTHER): Payer: BC Managed Care – PPO | Admitting: Physician Assistant

## 2013-07-02 VITALS — BP 117/86 | HR 88 | Temp 98.4°F | Resp 18 | Ht 68.5 in | Wt 184.0 lb

## 2013-07-02 DIAGNOSIS — R5383 Other fatigue: Secondary | ICD-10-CM

## 2013-07-02 DIAGNOSIS — R945 Abnormal results of liver function studies: Principal | ICD-10-CM

## 2013-07-02 DIAGNOSIS — R5381 Other malaise: Secondary | ICD-10-CM

## 2013-07-02 DIAGNOSIS — R197 Diarrhea, unspecified: Secondary | ICD-10-CM

## 2013-07-02 DIAGNOSIS — R7989 Other specified abnormal findings of blood chemistry: Secondary | ICD-10-CM

## 2013-07-02 DIAGNOSIS — R110 Nausea: Secondary | ICD-10-CM

## 2013-07-02 LAB — COMPREHENSIVE METABOLIC PANEL
ALT: 147 U/L — ABNORMAL HIGH (ref 0–53)
AST: 60 U/L — ABNORMAL HIGH (ref 0–37)
Albumin: 4.3 g/dL (ref 3.5–5.2)
Alkaline Phosphatase: 68 U/L (ref 39–117)
BUN: 12 mg/dL (ref 6–23)
CHLORIDE: 101 meq/L (ref 96–112)
CO2: 22 meq/L (ref 19–32)
CREATININE: 0.96 mg/dL (ref 0.50–1.35)
Calcium: 9.2 mg/dL (ref 8.4–10.5)
GLUCOSE: 77 mg/dL (ref 70–99)
Potassium: 3.9 mEq/L (ref 3.5–5.3)
Sodium: 135 mEq/L (ref 135–145)
Total Bilirubin: 0.7 mg/dL (ref 0.2–1.2)
Total Protein: 8 g/dL (ref 6.0–8.3)

## 2013-07-02 LAB — POCT SEDIMENTATION RATE: POCT SED RATE: 45 mm/hr — AB (ref 0–22)

## 2013-07-02 MED ORDER — ONDANSETRON 8 MG PO TBDP: 4.0000 mg | ORAL_TABLET | Freq: Three times a day (TID) | ORAL | Status: DC | PRN

## 2013-07-02 NOTE — Patient Instructions (Signed)
Get plenty of rest and drink at least 64 ounces of water daily.  I will contact you with your lab results as soon as they are available.   If you have not heard from me in 2 weeks, please contact me.  The fastest way to get your results is to register for My Chart (see the instructions on the last page of this printout).   

## 2013-07-02 NOTE — Progress Notes (Signed)
I have examined this patient along with the student and agree.  

## 2013-07-02 NOTE — Progress Notes (Signed)
Subjective:    Patient ID: Ryan Hays, male    DOB: 02-Aug-1985, 28 y.o.   MRN: [id number]  Abdominal Pain Associated symptoms include diarrhea, nausea and vomiting. Pertinent negatives include no fever.    27y.o male pt presents for follow up of elevated liver enzymes noted on recent visits for non specific febrile illness, thought probable prostatitis.  Also with 3 days of N/V/D.  Has been vomiting about 5x per day nonbloody and nonbilious and has been unable to keep any food down, but able to sip gatorade and water.  Also with diarrhea also about 5-6 times a day.  Diarrhea is non-bloody and non bilious.  He has not tried taking anything for these symptoms.  Pt mentions before his symptoms started, his 338 mo old daughter was spitting up more than usual and had diarrhea.  His mother in law watches his daughter during the day and she had the same symptoms the pt is now experiencing.  He was also placed on doxycyclin for possible occult tick infection on 4/6 and stopped taking abx bc he felt the medication may be causing his symptoms.  Denies travel, prior abd surgery, abd pain.  Pt also mentions he has felt generalized fatigue for past 8 months.  Denies new stressors at work, at home, in marriage.  Last two visits reviewed, labs reviewed, glucose nml, renal fx normal, tsh normal abd US shows GB with mildly thickened walls but no gallstones.  Also urinary symptoms have resolved.  EBV reveals past infection.  Pt also has had mildly elevated sed rate and positive ANA, no titer.   Review of Systems  Constitutional: Positive for fatigue. Negative for fever, diaphoresis and unexpected weight change.  HENT: Negative.   Eyes: Negative.   Respiratory: Negative.   Cardiovascular: Negative.   Gastrointestinal: Positive for nausea, vomiting and diarrhea. Negative for abdominal pain and blood in stool.  Endocrine: Negative.   Genitourinary: Negative.   Musculoskeletal: Negative.   Skin: Negative.     Allergic/Immunologic: Negative.   Neurological: Positive for light-headedness.  Hematological: Negative.        Objective:   Physical Exam  Constitutional: He is oriented to person, place, and time. He appears well-developed and well-nourished. No distress.  BP 117/86  Pulse 88  Temp(Src) 98.4 F (36.9 C) (Oral)  Resp 18  Ht 5' 8.5" (1.74 m)  Wt 184 lb (83.462 kg)  BMI 27.57 kg/m2  SpO2 98%   HENT:  Head: Normocephalic and atraumatic.  Eyes: Conjunctivae are normal. Pupils are equal, round, and reactive to light.  Cardiovascular: Normal rate, regular rhythm, normal heart sounds and intact distal pulses.  Exam reveals no gallop and no friction rub.   No murmur heard. Pulmonary/Chest: Effort normal and breath sounds normal. No respiratory distress. He has no wheezes.  Abdominal: Soft. Bowel sounds are normal. He exhibits no distension and no mass. There is no tenderness. There is no guarding.  Lymphadenopathy:    He has no cervical adenopathy.  Neurological: He is alert and oriented to person, place, and time.  Skin: Skin is warm and dry. No rash noted.  Psychiatric: He has a normal mood and affect. His behavior is normal.   Pt was orthostatic and received 2000cc of NS IV today     Assessment & Plan:   1. Elevated LFTs Awaiting lab results.  If ANA and titer positive will consider probable autoimmune hepatitis.  If LFTs persist or worsen, will refer to GI specialist. - Comprehensive  metabolic panel - CMV IgM - POCT SEDIMENTATION RATE - ANA  2. Fatigue Advised and reassured pt of nml lab results from recent visits.    3. Nausea 4. Diarrhea Probable viral etiology given hx of sick contacts with daughter and mother in law.  Will collect stool sample to r/o other causes. - ondansetron (ZOFRAN-ODT) 8 MG disintegrating tablet; Take 0.5-1 tablets (4-8 mg total) by mouth every 8 (eight) hours as needed for nausea or vomiting.  Dispense: 30 tablet; Refill: 0 - Clostridium  difficile EIA - Ova and parasite examination - Stool culture

## 2013-07-03 ENCOUNTER — Encounter: Payer: Self-pay | Admitting: Physician Assistant

## 2013-07-03 LAB — CMV IGM: CMV IgM: >240 AU/mL — ABNORMAL HIGH (ref <30.00)

## 2013-07-03 LAB — ANTI-NUCLEAR AB-TITER (ANA TITER): ANA Titer 1: 1:40 {titer} — ABNORMAL HIGH

## 2013-07-03 LAB — ANA: Anti Nuclear Antibody(ANA): POSITIVE — AB

## 2013-07-08 LAB — CLOSTRIDIUM DIFFICILE EIA: CDIFTX: NEGATIVE

## 2013-07-09 LAB — OVA AND PARASITE EXAMINATION: OP: NONE SEEN

## 2013-07-12 ENCOUNTER — Ambulatory Visit (INDEPENDENT_AMBULATORY_CARE_PROVIDER_SITE_OTHER): Payer: BC Managed Care – PPO | Admitting: Physician Assistant

## 2013-07-12 VITALS — BP 110/72 | HR 84 | Temp 97.8°F | Resp 18 | Ht 69.0 in | Wt 185.0 lb

## 2013-07-12 DIAGNOSIS — R945 Abnormal results of liver function studies: Principal | ICD-10-CM

## 2013-07-12 DIAGNOSIS — R7989 Other specified abnormal findings of blood chemistry: Secondary | ICD-10-CM

## 2013-07-12 DIAGNOSIS — R748 Abnormal levels of other serum enzymes: Secondary | ICD-10-CM

## 2013-07-12 LAB — STOOL CULTURE

## 2013-07-12 LAB — COMPREHENSIVE METABOLIC PANEL
ALT: 87 U/L — AB (ref 0–53)
AST: 38 U/L — AB (ref 0–37)
Albumin: 4.2 g/dL (ref 3.5–5.2)
Alkaline Phosphatase: 57 U/L (ref 39–117)
BILIRUBIN TOTAL: 0.8 mg/dL (ref 0.2–1.2)
BUN: 11 mg/dL (ref 6–23)
CALCIUM: 8.9 mg/dL (ref 8.4–10.5)
CHLORIDE: 103 meq/L (ref 96–112)
CO2: 24 meq/L (ref 19–32)
Creat: 0.87 mg/dL (ref 0.50–1.35)
Glucose, Bld: 96 mg/dL (ref 70–99)
Potassium: 4.1 mEq/L (ref 3.5–5.3)
SODIUM: 136 meq/L (ref 135–145)
Total Protein: 7.3 g/dL (ref 6.0–8.3)

## 2013-07-12 NOTE — Patient Instructions (Signed)
We will contact you with results of your liver function test

## 2013-07-12 NOTE — Progress Notes (Signed)
   Subjective:    Patient ID: Ryan Hays, male    DOB: Feb 11, 1986, 28 y.o.   MRN: [id number]  HPI  27y.o male presents for recheck of LFTs.  Pt with positive CMV, ANA (titer 1:40, speckled), and sed rate on 4/15.  Also with N/V/D on 4/15.  Considered to have probable GI virus with underlying CMV infection.  Mildly elevated LFTs on three consecutive visits.    Pt given zofran and 2 liters NS on 4/15 visit.  Today pt is without N/V/D or abd pain.  Tolerating normal diet and no longer taking zofran.  Denies urinary changes, fever, HA, fatigue.     Review of Systems  Constitutional: Negative.   HENT: Negative.   Eyes: Negative.   Respiratory: Negative.   Cardiovascular: Negative.   Gastrointestinal: Negative.   Endocrine: Negative.   Genitourinary: Negative.   Musculoskeletal: Negative.   Skin: Negative.   Allergic/Immunologic: Positive for environmental allergies.  Neurological: Negative.   Hematological: Negative.        Objective:   Physical Exam  Constitutional: He is oriented to person, place, and time. He appears well-developed and well-nourished. No distress.  BP 110/72  Pulse 84  Temp(Src) 97.8 F (36.6 C) (Oral)  Resp 18  Ht 5\' 9"  (1.753 m)  Wt 185 lb (83.915 kg)  BMI 27.31 kg/m2  SpO2 97%   HENT:  Head: Normocephalic.  Eyes: Conjunctivae are normal. Pupils are equal, round, and reactive to light.  Cardiovascular: Normal rate, regular rhythm, normal heart sounds and intact distal pulses.  Exam reveals no gallop and no friction rub.   No murmur heard. Pulmonary/Chest: Effort normal and breath sounds normal. No respiratory distress. He has no wheezes.  Abdominal: Soft. Bowel sounds are normal. He exhibits no distension and no mass. There is no tenderness.  No hepatosplenomegaly  Neurological: He is alert and oriented to person, place, and time.  Skin: Skin is warm and dry. No rash noted.  Psychiatric: He has a normal mood and affect. His behavior is normal.          Assessment & Plan:   1. Elevated LFTs AST 60; ALT 147 at 4/15 visit.  Awaiting lab results.  Likely due to CMV infection, expect normalization. If LFTs remain elevated, suspect autoimmune hepatitis and plan referral to GI. - Comprehensive metabolic panel

## 2013-07-12 NOTE — Progress Notes (Signed)
I directly supervised and participated in the procedure and agree with the student's documentation.  

## 2013-07-13 NOTE — Addendum Note (Signed)
Addended by: Fernande BrasJEFFERY, Jimeka Balan S on: 07/13/2013 11:03 AM   Modules accepted: Orders

## 2013-09-01 ENCOUNTER — Encounter: Payer: Self-pay | Admitting: Gastroenterology

## 2013-09-11 ENCOUNTER — Ambulatory Visit (INDEPENDENT_AMBULATORY_CARE_PROVIDER_SITE_OTHER): Payer: BC Managed Care – PPO | Admitting: Gastroenterology

## 2013-09-11 ENCOUNTER — Encounter: Payer: Self-pay | Admitting: Gastroenterology

## 2013-09-11 VITALS — BP 100/70 | HR 64 | Ht 69.0 in | Wt 186.0 lb

## 2013-09-11 DIAGNOSIS — R945 Abnormal results of liver function studies: Secondary | ICD-10-CM

## 2013-09-11 DIAGNOSIS — K648 Other hemorrhoids: Secondary | ICD-10-CM | POA: Insufficient documentation

## 2013-09-11 NOTE — Patient Instructions (Signed)
HEMORRHOID BANDING PROCEDURE    FOLLOW-UP CARE   1. The procedure you have had should have been relatively painless since the banding of the area involved does not have nerve endings and there is no pain sensation.  The rubber band cuts off the blood supply to the hemorrhoid and the band may fall off as soon as 48 hours after the banding (the band may occasionally be seen in the toilet bowl following a bowel movement). You may notice a temporary feeling of fullness in the rectum which should respond adequately to plain Tylenol or Motrin.  2. Following the banding, avoid strenuous exercise that evening and resume full activity the next day.  A sitz bath (soaking in a warm tub) or bidet is soothing, and can be useful for cleansing the area after bowel movements.     3. To avoid constipation, take two tablespoons of natural wheat bran, natural oat bran, flax, Benefiber or any over the counter fiber supplement and increase your water intake to 7-8 glasses daily.    4. Unless you have been prescribed anorectal medication, do not put anything inside your rectum for two weeks: No suppositories, enemas, fingers, etc.  5. Occasionally, you may have more bleeding than usual after the banding procedure.  This is often from the untreated hemorrhoids rather than the treated one.  Don't be concerned if there is a tablespoon or so of blood.  If there is more blood than this, lie flat with your bottom higher than your head and apply an ice pack to the area. If the bleeding does not stop within a half an hour or if you feel faint, call our office at [phone] or go to the emergency room.  6. Problems are not common; however, if there is a substantial amount of bleeding, severe pain, chills, fever or difficulty passing urine (very rare) or other problems, you should call us at [phone] or report to the nearest emergency room.  7. Do not stay seated continuously for more than 2-3 hours for a day or two  after the procedure.  Tighten your buttock muscles 10-15 times every two hours and take 10-15 deep breaths every 1-2 hours.  Do not spend more than a few minutes on the toilet if you cannot empty your bowel; instead re-visit the toilet at a later time.   Your 2nd banding is scheduled on 11/25/2013 at 1:30pm

## 2013-09-11 NOTE — Assessment & Plan Note (Signed)
Patient has symptomatic grade 3 hemorrhoids despite medical therapy.  Plan to proceed with band ligation

## 2013-09-11 NOTE — Assessment & Plan Note (Signed)
Abnormal LFTs coincided with viral symptoms.  Presumably this is 2 to a viral hepatitis which is clinically resolving.  This will be followed up by his PCP.

## 2013-09-11 NOTE — Progress Notes (Signed)
_                                                                                                                History of Present Illness: Pleasant 28 year old white male referred for evaluation of hemorrhoids.  For at least 2 years she's been bothered by rectal discomfort and frequent protrusion of internal hemorrhoids which he has to manually reduce.  He denies rectal bleeding.  He moves his bowels regularly.  He has tried various topicals without relief.  About a month ago he was having some nausea fatigue.  At the liver tests were noted.  According the patient liver tests have been slowly improving.  Recent ultrasound demonstrated some mild thickening of the gallbladder wall.    History reviewed. No pertinent past medical history. Past Surgical History  Procedure Laterality Date  . Wisdom tooth extraction     family history includes Diabetes in his maternal grandmother; Heart disease in his maternal grandfather; Hyperlipidemia in his mother; Lung cancer in his paternal grandmother; Mental illness in his mother. Current Outpatient Prescriptions  Medication Sig Dispense Refill  . albuterol (PROVENTIL HFA;VENTOLIN HFA) 108 (90 BASE) MCG/ACT inhaler Take 2 inhalations about 4 times daily if needed for wheezing or coughing  1 Inhaler  1   No current facility-administered medications for this visit.   Allergies as of 09/11/2013 - Review Complete 09/11/2013  Allergen Reaction Noted  . Molds & smuts Other (See Comments) 03/22/2013    reports that he has never smoked. He has never used smokeless tobacco. He reports that he drinks about .6 ounces of alcohol per week. He reports that he does not use illicit drugs.     Review of Systems: Pertinent positive and negative review of systems were noted in the above HPI section. All other review of systems were otherwise negative.  Vital signs were reviewed in today's medical record Physical Exam: General: Well developed ,  well nourished, no acute distress Skin: anicteric Head: Normocephalic and atraumatic Eyes:  sclerae anicteric, EOMI Ears: Normal auditory acuity Mouth: No deformity or lesions Neck: Supple, no masses or thyromegaly Lungs: Clear throughout to auscultation Heart: Regular rate and rhythm; no murmurs, rubs or bruits Abdomen: Soft, non tender and non distended. No masses, hepatosplenomegaly or hernias noted. Normal Bowel sounds Rectal:normal Musculoskeletal: Symmetrical with no gross deformities  Skin: No lesions on visible extremities Pulses:  Normal pulses noted Extremities: No clubbing, cyanosis, edema or deformities noted Neurological: Alert oriented x 4, grossly nonfocal Cervical Nodes:  No significant cervical adenopathy Inguinal Nodes: No significant inguinal adenopathy Psychological:  Alert and cooperative. Normal mood and affect  Anoscopy was performed and demonstrated internal hemorrhoids  PROCEDURE NOTE: The patient presents with symptomatic grade *3**  hemorrhoids, requesting rubber band ligation of his/her hemorrhoidal disease.  All risks, benefits and alternative forms of therapy were described and informed consent was obtained.   The anorectum was pre-medicated with lubricant and nitroglycerine ointment The decision was made to band the *right posterior** internal hemorrhoid, and the  [facility] O'Regan System was used to perform band ligation without complication.  Digital anorectal examination was then performed to assure proper positioning of the band, and to adjust the banded tissue as required.  The patient was discharged home without pain or other issues.  Dietary and behavioral recommendations were given and along with follow-up instructions.    The patient will return in *2** weeks for  follow-up and possible additional banding as required. No complications were encountered and the patient tolerated the procedure well.    See Assessment and Plan under Problem  List

## 2013-10-02 NOTE — Progress Notes (Signed)
History and physical examinations reviewed in detail with Benny LennertSarah Weber, PA-C.  CXR reviewed and WNL.  Agree with A/P.

## 2013-10-08 ENCOUNTER — Encounter: Payer: Self-pay | Admitting: Gastroenterology

## 2013-10-09 ENCOUNTER — Other Ambulatory Visit: Payer: Self-pay

## 2013-10-09 MED ORDER — HYDROCORTISONE ACE-PRAMOXINE 1-1 % RE FOAM: 1.0000 | Freq: Two times a day (BID) | RECTAL | Status: DC

## 2013-10-21 ENCOUNTER — Telehealth: Payer: Self-pay | Admitting: Physician Assistant

## 2013-10-21 NOTE — Telephone Encounter (Signed)
Sent My Chart message reminding patient to come in for repeat LFTs.

## 2013-11-25 ENCOUNTER — Ambulatory Visit (INDEPENDENT_AMBULATORY_CARE_PROVIDER_SITE_OTHER): Payer: BC Managed Care – PPO | Admitting: Gastroenterology

## 2013-11-25 ENCOUNTER — Encounter: Payer: Self-pay | Admitting: Gastroenterology

## 2013-11-25 VITALS — BP 102/72 | HR 76 | Ht 68.75 in | Wt 191.2 lb

## 2013-11-25 DIAGNOSIS — K648 Other hemorrhoids: Secondary | ICD-10-CM

## 2013-11-25 NOTE — Progress Notes (Signed)
PROCEDURE NOTE: The patient presents with symptomatic grade **3*  hemorrhoids, requesting rubber band ligation of his/her hemorrhoidal disease.  All risks, benefits and alternative forms of therapy were described and informed consent was obtained.   The anorectum was pre-medicated with lubricant and nitroglycerine ointment The decision was made to band the **right anterior* internal hemorrhoid, and the [facility] O'Regan System was used to perform band ligation without complication.  Digital anorectal examination was then performed to assure proper positioning of the band, and to adjust the banded tissue as required.  The patient was discharged home without pain or other issues.  Dietary and behavioral recommendations were given and along with follow-up instructions.    The patient will return in *2** weeks for  follow-up and possible additional banding as required. No complications were encountered and the patient tolerated the procedure well.   

## 2013-11-25 NOTE — Patient Instructions (Signed)
HEMORRHOID BANDING PROCEDURE    FOLLOW-UP CARE   1. The procedure you have had should have been relatively painless since the banding of the area involved does not have nerve endings and there is no pain sensation.  The rubber band cuts off the blood supply to the hemorrhoid and the band may fall off as soon as 48 hours after the banding (the band may occasionally be seen in the toilet bowl following a bowel movement). You may notice a temporary feeling of fullness in the rectum which should respond adequately to plain Tylenol or Motrin.  2. Following the banding, avoid strenuous exercise that evening and resume full activity the next day.  A sitz bath (soaking in a warm tub) or bidet is soothing, and can be useful for cleansing the area after bowel movements.     3. To avoid constipation, take two tablespoons of natural wheat bran, natural oat bran, flax, Benefiber or any over the counter fiber supplement and increase your water intake to 7-8 glasses daily.    4. Unless you have been prescribed anorectal medication, do not put anything inside your rectum for two weeks: No suppositories, enemas, fingers, etc.  5. Occasionally, you may have more bleeding than usual after the banding procedure.  This is often from the untreated hemorrhoids rather than the treated one.  Don't be concerned if there is a tablespoon or so of blood.  If there is more blood than this, lie flat with your bottom higher than your head and apply an ice pack to the area. If the bleeding does not stop within a half an hour or if you feel faint, call our office at [phone] or go to the emergency room.  6. Problems are not common; however, if there is a substantial amount of bleeding, severe pain, chills, fever or difficulty passing urine (very rare) or other problems, you should call us at [phone] or report to the nearest emergency room.  7. Do not stay seated continuously for more than 2-3 hours for a day or two  after the procedure.  Tighten your buttock muscles 10-15 times every two hours and take 10-15 deep breaths every 1-2 hours.  Do not spend more than a few minutes on the toilet if you cannot empty your bowel; instead re-visit the toilet at a later time.   Your 3rd banding is scheduled on 01/20/2014 at 2:15pm

## 2013-11-27 ENCOUNTER — Encounter: Payer: Self-pay | Admitting: *Deleted

## 2013-11-27 ENCOUNTER — Telehealth: Payer: Self-pay | Admitting: *Deleted

## 2013-11-27 NOTE — Telephone Encounter (Signed)
Called and left message for pt to advise RTC for follow up abnormal labs.

## 2014-01-13 ENCOUNTER — Ambulatory Visit (INDEPENDENT_AMBULATORY_CARE_PROVIDER_SITE_OTHER): Payer: BC Managed Care – PPO | Admitting: Family Medicine

## 2014-01-13 VITALS — BP 122/80 | HR 63 | Temp 97.5°F | Resp 16 | Ht 70.0 in | Wt 193.6 lb

## 2014-01-13 DIAGNOSIS — B179 Acute viral hepatitis, unspecified: Secondary | ICD-10-CM

## 2014-01-13 DIAGNOSIS — R7401 Elevation of levels of liver transaminase levels: Secondary | ICD-10-CM

## 2014-01-13 DIAGNOSIS — B259 Cytomegaloviral disease, unspecified: Secondary | ICD-10-CM

## 2014-01-13 DIAGNOSIS — R740 Nonspecific elevation of levels of transaminase and lactic acid dehydrogenase [LDH]: Secondary | ICD-10-CM

## 2014-01-13 DIAGNOSIS — Z23 Encounter for immunization: Secondary | ICD-10-CM

## 2014-01-13 DIAGNOSIS — R768 Other specified abnormal immunological findings in serum: Secondary | ICD-10-CM

## 2014-01-13 LAB — LIPID PANEL
Cholesterol: 227 mg/dL — ABNORMAL HIGH (ref 0–200)
HDL: 47 mg/dL (ref >39)
LDL Cholesterol: 156 mg/dL — ABNORMAL HIGH (ref 0–99)
TRIGLYCERIDES: 121 mg/dL (ref <150)
Total CHOL/HDL Ratio: 4.8 Ratio
VLDL: 24 mg/dL (ref 0–40)

## 2014-01-13 LAB — COMPREHENSIVE METABOLIC PANEL
ALBUMIN: 4.5 g/dL (ref 3.5–5.2)
ALT: 27 U/L (ref 0–53)
AST: 21 U/L (ref 0–37)
Alkaline Phosphatase: 51 U/L (ref 39–117)
BUN: 11 mg/dL (ref 6–23)
CO2: 25 meq/L (ref 19–32)
Calcium: 9.5 mg/dL (ref 8.4–10.5)
Chloride: 105 mEq/L (ref 96–112)
Creat: 0.91 mg/dL (ref 0.50–1.35)
GLUCOSE: 93 mg/dL (ref 70–99)
POTASSIUM: 4.5 meq/L (ref 3.5–5.3)
SODIUM: 139 meq/L (ref 135–145)
TOTAL PROTEIN: 7.7 g/dL (ref 6.0–8.3)
Total Bilirubin: 0.6 mg/dL (ref 0.2–1.2)

## 2014-01-13 NOTE — Progress Notes (Signed)
Subjective:    Patient ID: Ryan Hays, male    DOB: 1986-03-01, 28 y.o.   MRN: [id number] This chart was scribed for Norberto SorensonEva Nahima Ales, MD by Jolene Provostobert Halas, Medical Scribe. This patient was seen in Room 9 and the patient's care was started a 8:56 AM.  Chief Complaint  Patient presents with  . Flu Vaccine  . Follow-up    pt is here for lab follow up   HPI  History reviewed. No pertinent past medical history. Allergies  Allergen Reactions  . Molds & Smuts Other (See Comments)    Positive allergy test   Current Outpatient Prescriptions on File Prior to Visit  Medication Sig Dispense Refill  . albuterol (PROVENTIL HFA;VENTOLIN HFA) 108 (90 BASE) MCG/ACT inhaler Take 2 inhalations about 4 times daily if needed for wheezing or coughing  1 Inhaler  1   No current facility-administered medications on file prior to visit.    HPI Comments: Ryan Hays is a 28 y.o. male who [facility] for a follow up appointment due to abnormal labs. Pt states he originally reported to the doctor due to persistent fatigue. Labs performed showed abnormal liver function. Pt saw a GI doctor who diagnosed him with viral hepatitis.   Pt states he has delayed [facility] due to resolved symptoms. Pt states his energy level is back to normal. Pt is not having any pain. Pt denies nausea, abdominal pain, vomiting, HA, abnormal stools, blood in stools, dysuria, urinary hesitancy or urgency. Pt states he uses Excedrin 1-2 times per month. Pt drinks 3-4 drinks per week.  Pt had highest AST 93, ALT 147 six months prior. All other test normal, including C. Dif, O and P stool culture. Pt was told that he likely had a CMV infection as his IGM was very high. He also had a positive ANA 1 colon 40, which was speckled, and not likely to be clinically significant. No prior lipids seen in chart. Pt had a distant hx of an EDV infection. Acute hepatitis panel was negative, 6 mos prior.   Review of Systems    Constitutional: Negative for fever, chills and appetite change.  Gastrointestinal: Negative for nausea, vomiting and abdominal pain.  Genitourinary: Negative for dysuria, frequency and difficulty urinating.  Musculoskeletal: Negative for back pain and myalgias.       Objective:  BP 122/80  Pulse 63  Temp(Src) 97.5 F (36.4 C) (Oral)  Resp 16  Ht 5\' 10"  (1.778 m)  Wt 193 lb 9.6 oz (87.816 kg)  BMI 27.78 kg/m2  SpO2 98%  Physical Exam  Nursing note and vitals reviewed. Constitutional: He is oriented to person, place, and time. He appears well-developed and well-nourished.  HENT:  Head: Normocephalic and atraumatic.  Eyes: Pupils are equal, round, and reactive to light.  Neck: No JVD present. No thyromegaly present.  Cardiovascular: Normal rate and regular rhythm.   Pulmonary/Chest: Effort normal and breath sounds normal. No respiratory distress.  Abdominal: Soft. Bowel sounds are normal. He exhibits no distension. There is no hepatosplenomegaly. There is no tenderness.  Negative murphy's sign.  Lymphadenopathy:    He has no cervical adenopathy.  Neurological: He is alert and oriented to person, place, and time.  Skin: Skin is warm and dry.  Psychiatric: He has a normal mood and affect. His behavior is normal.          Assessment & Plan:  Need for prophylactic vaccination and inoculation against influenza - Plan: Flu Vaccine QUAD  36+ mos IM  Elevated transaminase level - Plan: Comprehensive metabolic panel, Lipid panel - here for repeat lfts after viral hepatitis due to CMV has resolved >6 mos prev  Cytomegalovirus  Acute viral hepatitis, unspecified viral hepatitis type  ANA positive    I personally performed the services described in this documentation, which was scribed in my presence. The recorded information has been reviewed and considered, and addended by me as needed.  Norberto SorensonEva Herron Fero, MD MPH  Results for orders placed or performed in visit on 01/13/14   Comprehensive metabolic panel  Result Value Ref Range   Sodium 139 135 - 145 mEq/L   Potassium 4.5 3.5 - 5.3 mEq/L   Chloride 105 96 - 112 mEq/L   CO2 25 19 - 32 mEq/L   Glucose, Bld 93 70 - 99 mg/dL   BUN 11 6 - 23 mg/dL   Creat 4.010.91 0.270.50 - 2.531.35 mg/dL   Total Bilirubin 0.6 0.2 - 1.2 mg/dL   Alkaline Phosphatase 51 39 - 117 U/L   AST 21 0 - 37 U/L   ALT 27 0 - 53 U/L   Total Protein 7.7 6.0 - 8.3 g/dL   Albumin 4.5 3.5 - 5.2 g/dL   Calcium 9.5 8.4 - 66.410.5 mg/dL  Lipid panel  Result Value Ref Range   Cholesterol 227 (H) 0 - 200 mg/dL   Triglycerides 403121 <474<150 mg/dL   HDL 47 >25>39 mg/dL   Total CHOL/HDL Ratio 4.8 Ratio   VLDL 24 0 - 40 mg/dL   LDL Cholesterol 956156 (H) 0 - 99 mg/dL

## 2014-01-20 ENCOUNTER — Encounter: Payer: Self-pay | Admitting: Gastroenterology

## 2014-01-20 ENCOUNTER — Ambulatory Visit (INDEPENDENT_AMBULATORY_CARE_PROVIDER_SITE_OTHER): Payer: BC Managed Care – PPO | Admitting: Gastroenterology

## 2014-01-20 VITALS — BP 110/60 | HR 76 | Ht 70.0 in | Wt 194.2 lb

## 2014-01-20 DIAGNOSIS — K648 Other hemorrhoids: Secondary | ICD-10-CM

## 2014-01-20 NOTE — Progress Notes (Signed)
PROCEDURE NOTE: The patient presents with symptomatic grade **3*  hemorrhoids, requesting rubber band ligation of his/her hemorrhoidal disease.  All risks, benefits and alternative forms of therapy were described and informed consent was obtained.   The anorectum was pre-medicated with lubricant and nitroglycerine ointment The decision was made to band the *left lateral** internal hemorrhoid, and the [facility] O'Regan System was used to perform band ligation without complication.  Digital anorectal examination was then performed to assure proper positioning of the band, and to adjust the banded tissue as required.  The patient was discharged home without pain or other issues.  Dietary and behavioral recommendations were given and along with follow-up instructions.    The patient will return in *4** weeks for  follow-up and possible additional banding as required. No complications were encountered and the patient tolerated the procedure well.   

## 2014-01-20 NOTE — Patient Instructions (Signed)
HEMORRHOID BANDING PROCEDURE    FOLLOW-UP CARE   1. The procedure you have had should have been relatively painless since the banding of the area involved does not have nerve endings and there is no pain sensation.  The rubber band cuts off the blood supply to the hemorrhoid and the band may fall off as soon as 48 hours after the banding (the band may occasionally be seen in the toilet bowl following a bowel movement). You may notice a temporary feeling of fullness in the rectum which should respond adequately to plain Tylenol or Motrin.  2. Following the banding, avoid strenuous exercise that evening and resume full activity the next day.  A sitz bath (soaking in a warm tub) or bidet is soothing, and can be useful for cleansing the area after bowel movements.     3. To avoid constipation, take two tablespoons of natural wheat bran, natural oat bran, flax, Benefiber or any over the counter fiber supplement and increase your water intake to 7-8 glasses daily.    4. Unless you have been prescribed anorectal medication, do not put anything inside your rectum for two weeks: No suppositories, enemas, fingers, etc.  5. Occasionally, you may have more bleeding than usual after the banding procedure.  This is often from the untreated hemorrhoids rather than the treated one.  Don't be concerned if there is a tablespoon or so of blood.  If there is more blood than this, lie flat with your bottom higher than your head and apply an ice pack to the area. If the bleeding does not stop within a half an hour or if you feel faint, call our office at [phone] or go to the emergency room.  6. Problems are not common; however, if there is a substantial amount of bleeding, severe pain, chills, fever or difficulty passing urine (very rare) or other problems, you should call us at [phone] or report to the nearest emergency room.  7. Do not stay seated continuously for more than 2-3 hours for a day or two  after the procedure.  Tighten your buttock muscles 10-15 times every two hours and take 10-15 deep breaths every 1-2 hours.  Do not spend more than a few minutes on the toilet if you cannot empty your bowel; instead re-visit the toilet at a later time.   Your follow up appointment is scheduled on 03/25/2014 at 1:45pm

## 2014-03-25 ENCOUNTER — Ambulatory Visit: Payer: BC Managed Care – PPO | Admitting: Gastroenterology

## 2014-05-05 ENCOUNTER — Ambulatory Visit (INDEPENDENT_AMBULATORY_CARE_PROVIDER_SITE_OTHER): Payer: BLUE CROSS/BLUE SHIELD | Admitting: Gastroenterology

## 2014-05-05 ENCOUNTER — Encounter: Payer: Self-pay | Admitting: Gastroenterology

## 2014-05-05 VITALS — BP 110/78 | HR 80 | Ht 70.0 in | Wt 196.6 lb

## 2014-05-05 DIAGNOSIS — K648 Other hemorrhoids: Secondary | ICD-10-CM

## 2014-05-05 NOTE — Progress Notes (Signed)
      History of Present Illness:  Mr. Luanna Salkarks-Ksor has returned for follow-up of hemorrhoids.  He underwent band ligation 3.  Hemorrhoidal symptoms have entirely subsided.    Review of Systems: Pertinent positive and negative review of systems were noted in the above HPI section. All other review of systems were otherwise negative.    Current Medications, Allergies, Past Medical History, Past Surgical History, Family History and Social History were reviewed in [facility] electronic medical record  Vital signs were reviewed in today's medical record. Physical Exam: General: Well developed , well nourished, no acute distress t  See Assessment and Plan under Problem List

## 2014-05-05 NOTE — Assessment & Plan Note (Signed)
Status post band ligation 3 with complete medication of symptoms.  Patient will return as needed

## 2014-05-05 NOTE — Patient Instructions (Signed)
Follow up as needed

## 2014-08-24 ENCOUNTER — Ambulatory Visit (INDEPENDENT_AMBULATORY_CARE_PROVIDER_SITE_OTHER): Payer: BLUE CROSS/BLUE SHIELD | Admitting: Family Medicine

## 2014-08-24 VITALS — BP 118/80 | HR 98 | Temp 98.5°F | Resp 17 | Ht 68.0 in | Wt 189.4 lb

## 2014-08-24 DIAGNOSIS — Q818 Other epidermolysis bullosa: Secondary | ICD-10-CM

## 2014-08-24 DIAGNOSIS — Q829 Congenital malformation of skin, unspecified: Secondary | ICD-10-CM | POA: Diagnosis not present

## 2014-08-24 DIAGNOSIS — Q819 Epidermolysis bullosa, unspecified: Secondary | ICD-10-CM

## 2014-08-24 DIAGNOSIS — R238 Other skin changes: Secondary | ICD-10-CM | POA: Diagnosis not present

## 2014-08-24 MED ORDER — MUPIROCIN 2 % EX OINT: TOPICAL_OINTMENT | CUTANEOUS | Status: DC

## 2014-08-24 NOTE — Progress Notes (Signed)
  Subjective:  Patient ID: Ryan Hays, male    DOB: May 07, 1985  Age: 29 y.o. 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 He has known he has had this since early childhood. When he was a teenager he had more problems. Saturday he went fishing and walk several miles. He now has severe blisters on both of his feet. He started using crutches and has blisters on his hands. He came in here before popping the blisters.  It is been a long time since he saw a dermatologist for this condition. They have not done anything other than local care in the past.    Objective:   Intact blisters on both feet, fairly large on the lateral aspect of the left foot measuring about 3 x 7 cm. Scattered other blisters on both of the feet. He has bilateral palmar blisters. None of these look inflamed or infected. He does not have any that are ruptured yet.  Assessment & Plan:   Assessment: Weber-Cockayne syndrome Epidermolysis bullosa Blisters both feet and both palms Plan:  Advise leaving the blisters intact. Try get him in with a dermatologist in the next couple of days. If he does not get in with him he is to come back here in 3 days for a recheck. Use some mupirocin ointment on the blisters if they rupture. Patient Instructions  Leave blisters intact for the next several days unless they burst on their own.  Stay off work and keep feet elevated  Minimize weightbearing on your crutches which is causing blistering on your hands  If the blisters get larger, we will have to open them. When they pop you should use a stick free dressing on them such as a Telfa dressing.  Referral will be made back to the dermatologist [facility]([place] [facility]) to find out if there is any other specialized treatments that are available. There are some dressings available for active sores caused by the blistering such as Mepitel or Urgotel  which can be obtained by the pharmacy on special order, and should not are a prescription. However I think it would be best for a dermatologist to decide if you needed to use these kind of more expensive therapies.  Use mupirocin ointment on any open sores.  Keep the skin clean by gently washing it or by making a dilute solution with a little bleach in it to gently wash the skin  Take Tylenol (acetaminophen) 1000 mg (2500 mg) 2 tablets 3 times daily as needed for pain. You can take ibuprofen 800 mg 3 times daily in addition to that if needed for further pain control.  Plan to return to urgent medical and family care to see Dr. Janace Hoardavid Hopper on Thursday if you have not gotten in with a dermatologist yet    HOPPER,DAVID, MD 08/24/2014

## 2014-08-24 NOTE — Patient Instructions (Addendum)
Leave blisters intact for the next several days unless they burst on their own.  Stay off work and keep feet elevated  Minimize weightbearing on your crutches which is causing blistering on your hands  If the blisters get larger, we will have to open them. When they pop you should use a stick free dressing on them such as a Telfa dressing.  Referral will be made back to the dermatologist [facility]([place] [facility]) to find out if there is any other specialized treatments that are available. There are some dressings available for active sores caused by the blistering such as Mepitel or Urgotel which can be obtained by the pharmacy on special order, and should not are a prescription. However I think it would be best for a dermatologist to decide if you needed to use these kind of more expensive therapies.  Use mupirocin ointment on any open sores.  Keep the skin clean by gently washing it or by making a dilute solution with a little bleach in it to gently wash the skin  Take Tylenol (acetaminophen) 1000 mg (2500 mg) 2 tablets 3 times daily as needed for pain. You can take ibuprofen 800 mg 3 times daily in addition to that if needed for further pain control.  Plan to return to urgent medical and family care to see Dr. Janace Hoardavid Hopper on Thursday if you have not gotten in with a dermatologist yet

## 2018-04-23 ENCOUNTER — Other Ambulatory Visit: Payer: Self-pay

## 2018-04-23 ENCOUNTER — Emergency Department (HOSPITAL_COMMUNITY)
Admission: EM | Admit: 2018-04-23 | Discharge: 2018-04-23 | Disposition: A | Discharge status: Home / Self Care | Payer: Commercial Managed Care - PPO | Attending: Emergency Medicine | Admitting: Emergency Medicine

## 2018-04-23 ENCOUNTER — Emergency Department (HOSPITAL_COMMUNITY): Payer: Commercial Managed Care - PPO

## 2018-04-23 ENCOUNTER — Encounter (HOSPITAL_COMMUNITY): Payer: Self-pay

## 2018-04-23 DIAGNOSIS — R1010 Upper abdominal pain, unspecified: Secondary | ICD-10-CM | POA: Diagnosis not present

## 2018-04-23 DIAGNOSIS — R079 Chest pain, unspecified: Secondary | ICD-10-CM | POA: Diagnosis present

## 2018-04-23 DIAGNOSIS — R0789 Other chest pain: Secondary | ICD-10-CM

## 2018-04-23 LAB — CBC
HCT: 49.7 % (ref 39.0–52.0)
Hemoglobin: 16.6 g/dL (ref 13.0–17.0)
MCH: 29 pg (ref 26.0–34.0)
MCHC: 33.4 g/dL (ref 30.0–36.0)
MCV: 86.7 fL (ref 80.0–100.0)
Platelets: 323 10*3/uL (ref 150–400)
RBC: 5.73 MIL/uL (ref 4.22–5.81)
RDW: 11.9 % (ref 11.5–15.5)
WBC: 19.7 10*3/uL — ABNORMAL HIGH (ref 4.0–10.5)
nRBC: 0 % (ref 0.0–0.2)

## 2018-04-23 LAB — I-STAT TROPONIN, ED
Troponin i, poc: 0.05 ng/mL (ref 0.00–0.08)
Troponin i, poc: 0 ng/mL (ref 0.00–0.08)

## 2018-04-23 LAB — COMPREHENSIVE METABOLIC PANEL
ALT: 35 U/L (ref 0–44)
AST: 28 U/L (ref 15–41)
Albumin: 4.4 g/dL (ref 3.5–5.0)
Alkaline Phosphatase: 60 U/L (ref 38–126)
Anion gap: 15 (ref 5–15)
BUN: 16 mg/dL (ref 6–20)
CO2: 16 mmol/L — AB (ref 22–32)
Calcium: 9.7 mg/dL (ref 8.9–10.3)
Chloride: 107 mmol/L (ref 98–111)
Creatinine, Ser: 1.14 mg/dL (ref 0.61–1.24)
GFR calc Af Amer: >60 mL/min (ref >60)
GFR calc non Af Amer: >60 mL/min (ref >60)
Glucose, Bld: 124 mg/dL — ABNORMAL HIGH (ref 70–99)
Potassium: 4.2 mmol/L (ref 3.5–5.1)
Sodium: 138 mmol/L (ref 135–145)
Total Bilirubin: 1.2 mg/dL (ref 0.3–1.2)
Total Protein: 8.1 g/dL (ref 6.5–8.1)

## 2018-04-23 LAB — POCT I-STAT EG7
Acid-Base Excess: 3 mmol/L — ABNORMAL HIGH (ref 0.0–2.0)
Bicarbonate: 24.8 mmol/L (ref 20.0–28.0)
Calcium, Ion: 1.17 mmol/L (ref 1.15–1.40)
HCT: 49 % (ref 39.0–52.0)
Hemoglobin: 16.7 g/dL (ref 13.0–17.0)
O2 SAT: 68 %
PH VEN: 7.528 — AB (ref 7.250–7.430)
Potassium: 3.8 mmol/L (ref 3.5–5.1)
Sodium: 136 mmol/L (ref 135–145)
TCO2: 26 mmol/L (ref 22–32)
pCO2, Ven: 29.8 mmHg — ABNORMAL LOW (ref 44.0–60.0)
pO2, Ven: 31 mmHg — CL (ref 32.0–45.0)

## 2018-04-23 LAB — URINALYSIS, ROUTINE W REFLEX MICROSCOPIC
Bilirubin Urine: NEGATIVE
Glucose, UA: NEGATIVE mg/dL
Hgb urine dipstick: NEGATIVE
Ketones, ur: 5 mg/dL — AB
Leukocytes, UA: NEGATIVE
Nitrite: NEGATIVE
Protein, ur: NEGATIVE mg/dL
Specific Gravity, Urine: 1.023 (ref 1.005–1.030)
pH: 8 (ref 5.0–8.0)

## 2018-04-23 LAB — LIPASE, BLOOD: Lipase: 23 U/L (ref 11–51)

## 2018-04-23 LAB — D-DIMER, QUANTITATIVE: D-Dimer, Quant: 0.32 ug/mL-FEU (ref 0.00–0.50)

## 2018-04-23 LAB — LACTIC ACID, PLASMA: Lactic Acid, Venous: 2.7 mmol/L (ref 0.5–1.9)

## 2018-04-23 LAB — SALICYLATE LEVEL

## 2018-04-23 MED ORDER — FAMOTIDINE 20 MG PO TABS: 20.0000 mg | ORAL_TABLET | Freq: Two times a day (BID) | ORAL | 0 refills | Status: DC

## 2018-04-23 MED ORDER — ONDANSETRON 4 MG PO TBDP
4.0000 mg | ORAL_TABLET | Freq: Once | ORAL | Status: AC | PRN
  Administered 2018-04-23: 4 mg via ORAL
  Filled 2018-04-23: qty 1

## 2018-04-23 MED ORDER — ONDANSETRON HCL 4 MG PO TABS: 4.0000 mg | ORAL_TABLET | Freq: Four times a day (QID) | ORAL | 0 refills | Status: DC | PRN

## 2018-04-23 MED ORDER — ONDANSETRON HCL 4 MG/2ML IJ SOLN
4.0000 mg | Freq: Once | INTRAMUSCULAR | Status: AC
  Administered 2018-04-23: 4 mg via INTRAVENOUS
  Filled 2018-04-23: qty 2

## 2018-04-23 MED ORDER — SODIUM CHLORIDE 0.9% FLUSH
3.0000 mL | Freq: Once | INTRAVENOUS | Status: AC
  Administered 2018-04-23: 3 mL via INTRAVENOUS

## 2018-04-23 MED ORDER — MORPHINE SULFATE (PF) 4 MG/ML IV SOLN
8.0000 mg | Freq: Once | INTRAVENOUS | Status: AC
  Administered 2018-04-23: 8 mg via INTRAVENOUS
  Filled 2018-04-23: qty 2

## 2018-04-23 MED ORDER — LACTATED RINGERS IV BOLUS
1000.0000 mL | Freq: Once | INTRAVENOUS | Status: AC
  Administered 2018-04-23: 1000 mL via INTRAVENOUS

## 2018-04-23 MED ORDER — IOHEXOL 300 MG/ML  SOLN
100.0000 mL | Freq: Once | INTRAMUSCULAR | Status: AC | PRN
  Administered 2018-04-23: 100 mL via INTRAVENOUS

## 2018-04-23 NOTE — ED Triage Notes (Signed)
Pt presents for evaluation of chest pain with cough. Cough x3 weeks after flu. Reports nausea/vomiting this morning.

## 2018-04-23 NOTE — ED Provider Notes (Signed)
[facility] EM[facility]GENC[facility] Provider Note   CSN: [id number] Arrival date & time: 04/23/18  1020     History   Chief Complaint Chief Complaint  Patient presents with  . Chest Pain  . Nausea    HPI Ryan Hays is a 33 y.o. male.  HPI  33 year old male presents with chest pain.  Started all of a sudden at 6 AM.  The patient previously was feeling well save for a chronic cough that has been going on since the beginning of January when he had the flu.  The cough is not better or worse.  He all of a sudden has chest pain that originally felt like GERD but now feels like a heaviness to his chest.  He is having shortness of breath and feels dizzy when he stands up.  He vomited twice and feels nauseated.  No back pain.  No fevers.  Has not tried anything specific for the pain.  No recent travel or surgeries or leg swelling. The pain worsens with inspiration.   History reviewed. No pertinent past medical history.  Patient Active Problem List   Diagnosis Date Noted  . 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 Epidermolysis bullosa, congenital 08/24/2014  . Internal hemorrhoids with complication 09/11/2013  . Nonspecific abnormal results of liver function study 09/11/2013    Past Surgical History:  Procedure Laterality Date  . WISDOM TOOTH EXTRACTION          Home Medications    Prior to Admission medications   Medication Sig Start Date End Date Taking? Authorizing Provider  albuterol (PROVENTIL HFA;VENTOLIN HFA) 108 (90 BASE) MCG/ACT inhaler Take 2 inhalations about 4 times daily if needed for wheezing or coughing Patient not taking: Reported on 08/24/2014 03/22/13   Peyton NajjarHopper, David H, MD  mupirocin ointment Idelle Jo(BACTROBAN) 2 % Use twice daily on blisters that rupture 08/24/14   Peyton NajjarHopper, David H, MD    Family History Family History  Problem Relation Age of Onset  . Hyperlipidemia Mother   . Mental illness Mother   . Diabetes Maternal Grandmother   . Heart  disease Maternal Grandfather   . Lung cancer Paternal Grandmother     Social History Social History   Tobacco Use  . Smoking status: Never Smoker  . Smokeless tobacco: Never Used  Substance Use Topics  . Alcohol use: Yes    Alcohol/week: 1.0 standard drinks    Types: 1 Cans of beer per week    Comment: 2 per week  . Drug use: No     Allergies   Molds & smuts   Review of Systems Review of Systems  Constitutional: Negative for fever.  Respiratory: Positive for shortness of breath.   Cardiovascular: Positive for chest pain. Negative for leg swelling.  Gastrointestinal: Positive for nausea and vomiting.  Neurological: Positive for light-headedness.  All other systems reviewed and are negative.    Physical Exam Updated Vital Signs BP 120/86   Pulse (!) 108   Temp (!) 97.5 F (36.4 C) (Oral)   Resp (!) 23   SpO2 97%   Physical Exam Vitals signs and nursing note reviewed.  Constitutional:      Appearance: He is well-developed.  HENT:     Head: Normocephalic and atraumatic.     Right Ear: External ear normal.     Left Ear: External ear normal.     Nose: Nose normal.  Eyes:     General:        Right eye: No  discharge.        Left eye: No discharge.  Neck:     Musculoskeletal: Neck supple.  Cardiovascular:     Rate and Rhythm: Regular rhythm. Tachycardia present.     Heart sounds: Normal heart sounds.  Pulmonary:     Effort: Pulmonary effort is normal. No accessory muscle usage or respiratory distress.     Breath sounds: Normal breath sounds.  Chest:     Chest wall: No tenderness.  Abdominal:     Palpations: Abdomen is soft.     Tenderness: There is abdominal tenderness in the right upper quadrant, epigastric area and left upper quadrant.  Musculoskeletal:     Right lower leg: He exhibits no tenderness. No edema.     Left lower leg: He exhibits no tenderness. No edema.  Skin:    General: Skin is warm and dry.  Neurological:     Mental Status: He is  alert.  Psychiatric:        Mood and Affect: Mood is not anxious.      ED Treatments / Results  Labs (all labs ordered are listed, but only abnormal results are displayed) Labs Reviewed  COMPREHENSIVE METABOLIC PANEL - Abnormal; Notable for the following components:      Result Value   CO2 16 (*)    Glucose, Bld 124 (*)    All other components within normal limits  CBC - Abnormal; Notable for the following components:   WBC 19.7 (*)    All other components within normal limits  LACTIC ACID, PLASMA - Abnormal; Notable for the following components:   Lactic Acid, Venous 2.7 (*)    All other components within normal limits  POCT I-STAT EG7 - Abnormal; Notable for the following components:   pH, Ven 7.528 (*)    pCO2, Ven 29.8 (*)    pO2, Ven 31.0 (*)    Acid-Base Excess 3.0 (*)    All other components within normal limits  LIPASE, BLOOD  D-DIMER, QUANTITATIVE (NOT AT [facility])  SALICYLATE LEVEL  BLOOD GAS, VENOUS  URINALYSIS, ROUTINE W REFLEX MICROSCOPIC  I-STAT TROPONIN, ED  I-STAT TROPONIN, ED    EKG EKG Interpretation  Date/Time:  Tuesday April 23 2018 12:48:43 EST Ventricular Rate:  107 PR Interval:  116 QRS Duration: 100 QT Interval:  331 QTC Calculation: 442 R Axis:   63 Text Interpretation:  Sinus tachycardia otherwise similar to earlier in the day Confirmed by Pricilla Loveless [id number]) on 04/23/2018 1:06:57 PM   Radiology Dg Chest 2 View  Result Date: 04/23/2018 CLINICAL DATA:  Chest pain.  Shortness of breath. EXAM: CHEST - 2 VIEW COMPARISON:  06/20/2013. FINDINGS: Mediastinum and hilar structures normal. Lungs are clear. No pleural effusion or pneumothorax. Heart size normal. No acute bony abnormality. IMPRESSION: Chest is stable from prior exam. Electronically Signed   By: Maisie Fus  Register   On: 04/23/2018 11:25   US Abdomen Limited Ruq  Result Date: 04/23/2018 CLINICAL DATA:  Upper abdominal pain for 1 day. EXAM: ULTRASOUND ABDOMEN LIMITED RIGHT UPPER  QUADRANT COMPARISON:  None. FINDINGS: Gallbladder: No gallstones or wall thickening visualized. No sonographic Murphy sign noted by sonographer. Common bile duct: Diameter: 4.3 mm Liver: No focal lesion identified. Within normal limits in parenchymal echogenicity. Portal vein is patent on color Doppler imaging with normal direction of blood flow towards the liver. IMPRESSION: Normal examination. Electronically Signed   By: Beckie Salts M.D.   On: 04/23/2018 14:27    Procedures Ultrasound ED Echo Date/Time: 04/23/2018  12:33 PM Performed by: Pricilla Loveless, MD Authorized by: Pricilla Loveless, MD   Procedure details:    Indications: chest pain     Views: subxiphoid and parasternal long axis view     Images: archived     Limitations:  Acoustic shadowing Findings:    Findings: no pericardial effusion   Impression: no sonographic evidence of significant pericardial effusion     (including critical care time)  Medications Ordered in ED Medications  sodium chloride flush (NS) 0.9 % injection 3 mL (3 mLs Intravenous Given 04/23/18 1301)  ondansetron (ZOFRAN-ODT) disintegrating tablet 4 mg (4 mg Oral Given 04/23/18 1053)  morphine 4 MG/ML injection 8 mg (8 mg Intravenous Given 04/23/18 1301)  ondansetron (ZOFRAN) injection 4 mg (4 mg Intravenous Given 04/23/18 1301)  lactated ringers bolus 1,000 mL (0 mLs Intravenous Stopped 04/23/18 1415)  lactated ringers bolus 1,000 mL (0 mLs Intravenous Stopped 04/23/18 1604)  iohexol (OMNIPAQUE) 300 MG/ML solution 100 mL (100 mLs Intravenous Contrast Given 04/23/18 1616)     Initial Impression / Assessment and Plan / ED Course  I have reviewed the triage vital signs and the nursing notes.  Pertinent labs & imaging results that were available during my care of the patient were reviewed by me and considered in my medical decision making (see chart for details).     Patient's chest pain appears to be more upper abdominal pain.  Thus right upper quadrant ultrasound  ordered and shows no obvious cholecystitis or gallbladder pathology.  He has a significant WBC and a mild lactic acid, which may both be acute phase reactants to the acute vomiting and pain.  While he reports chest pain, it still seems more abdominal though 2 troponins were obtained and are both negative.  ECG shows tachycardia but no ischemic changes.  Considered PE but otherwise is low risk and with negative d-dimer and no hypoxia I think this is less likely.  He is feeling much better after pain control and nausea control with IV fluids in the ED.  We will get CT abdomen pelvis, if this is negative and he still feeling okay I think he will be stable for discharge home with PPI. Care to Dr. Ranae Palms with CT pending.  Final Clinical Impressions(s) / ED Diagnoses   Final diagnoses:  Upper abdominal pain    ED Discharge Orders    None       Pricilla Loveless, MD 04/23/18 [phone]

## 2018-04-23 NOTE — ED Notes (Signed)
Pt in xray, will be brought to room by transport.

## 2018-04-23 NOTE — ED Notes (Signed)
Discharge instructions and prescriptions discussed with Pt. Pt verbalized understanding. Pt stable and ambulatory.   

## 2018-04-23 NOTE — ED Notes (Signed)
Patient transported to CT 

## 2018-04-23 NOTE — ED Notes (Signed)
Pt to US.

## 2018-04-23 NOTE — ED Notes (Signed)
Pt given urinal and will provide a sample when able.

## 2019-07-11 ENCOUNTER — Ambulatory Visit: Payer: Commercial Managed Care - PPO | Attending: Internal Medicine

## 2019-07-11 DIAGNOSIS — Z23 Encounter for immunization: Secondary | ICD-10-CM

## 2019-07-11 NOTE — Progress Notes (Signed)
   Covid-19 Vaccination Clinic  Name:  AAMARI STRAWDERMAN    MRN: [id number] DOB: 1985-10-08  07/11/2019  Mr. Hagemann was observed post Covid-19 immunization for 15 minutes without incident. He was provided with Vaccine Information Sheet and instruction to access the V-Safe system.   Mr. Burleson was instructed to call 911 with any severe reactions post vaccine: Marland Kitchen Difficulty breathing  . Swelling of face and throat  . A fast heartbeat  . A bad rash all over body  . Dizziness and weakness   Immunizations Administered    Name Date Dose VIS Date Route   Pfizer COVID-19 Vaccine 07/11/2019 12:56 PM 0.3 mL 05/14/2018 Intramuscular   Manufacturer: [employer], [employer]   Lot: [id number]   NDC: [address]-0

## 2019-08-04 ENCOUNTER — Ambulatory Visit: Payer: Commercial Managed Care - PPO | Attending: Internal Medicine

## 2019-08-04 DIAGNOSIS — Z23 Encounter for immunization: Secondary | ICD-10-CM

## 2019-08-04 NOTE — Progress Notes (Signed)
   Covid-19 Vaccination Clinic  Name:  Ryan Hays    MRN: [id number] DOB: 02-11-1986  08/04/2019  Mr. Ryan Hays was observed post Covid-19 immunization for 15 minutes without incident. He was provided with Vaccine Information Sheet and instruction to access the V-Safe system.   Mr. Ryan Hays was instructed to call 911 with any severe reactions post vaccine: Marland Kitchen Difficulty breathing  . Swelling of face and throat  . A fast heartbeat  . A bad rash all over body  . Dizziness and weakness   Immunizations Administered    Name Date Dose VIS Date Route   Pfizer COVID-19 Vaccine 08/04/2019  8:12 AM 0.3 mL 05/14/2018 Intramuscular   Manufacturer: [employer], [employer]   Lot: [id number]   NDC: [address]-5

## 2020-06-12 IMAGING — DX DG CHEST 2V
2 series · 2 of 2 positions shown · non-contrast
Comparison: 06/20/2013.

CLINICAL DATA: Chest pain.  Shortness of breath.

EXAM:
CHEST - 2 VIEW

[chest lat]
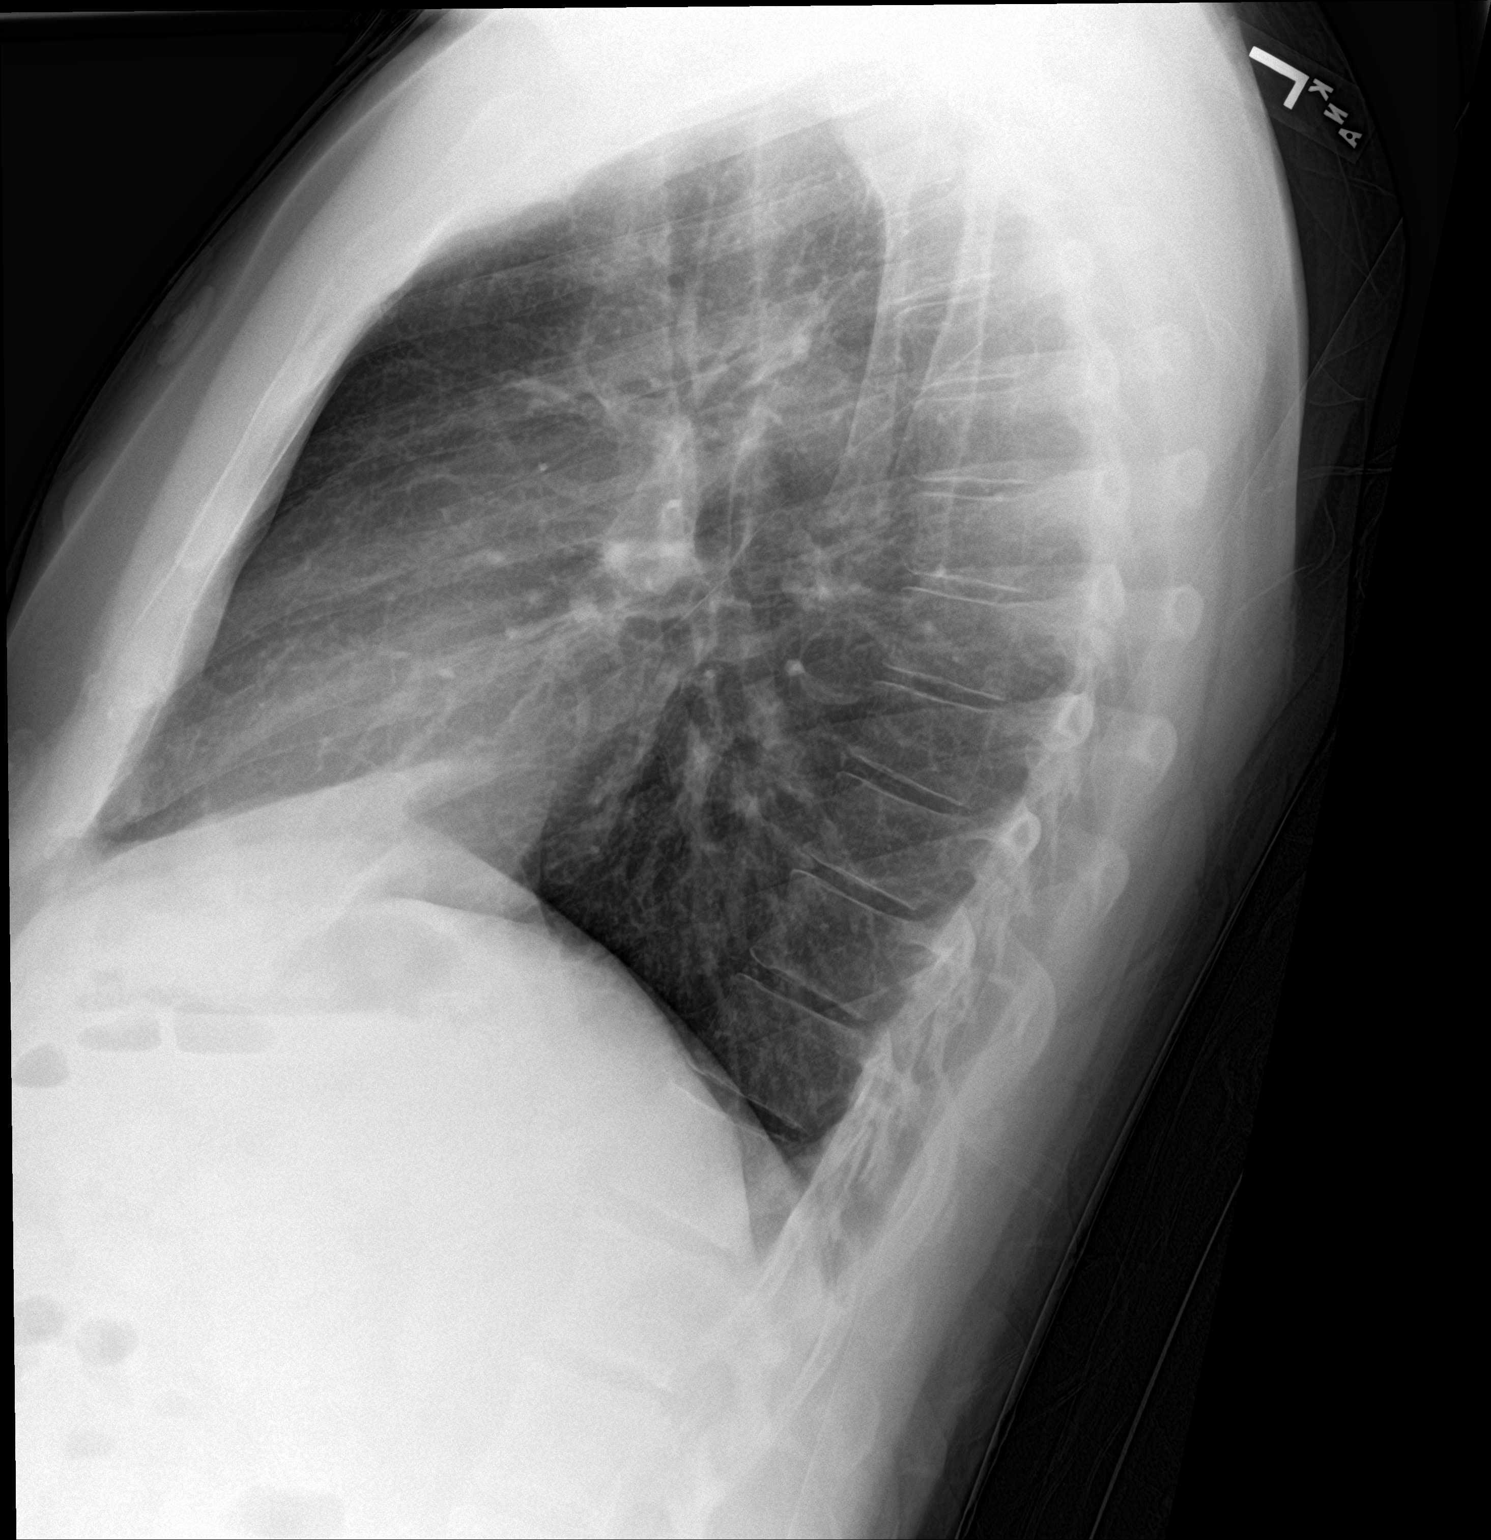

[chest ap]
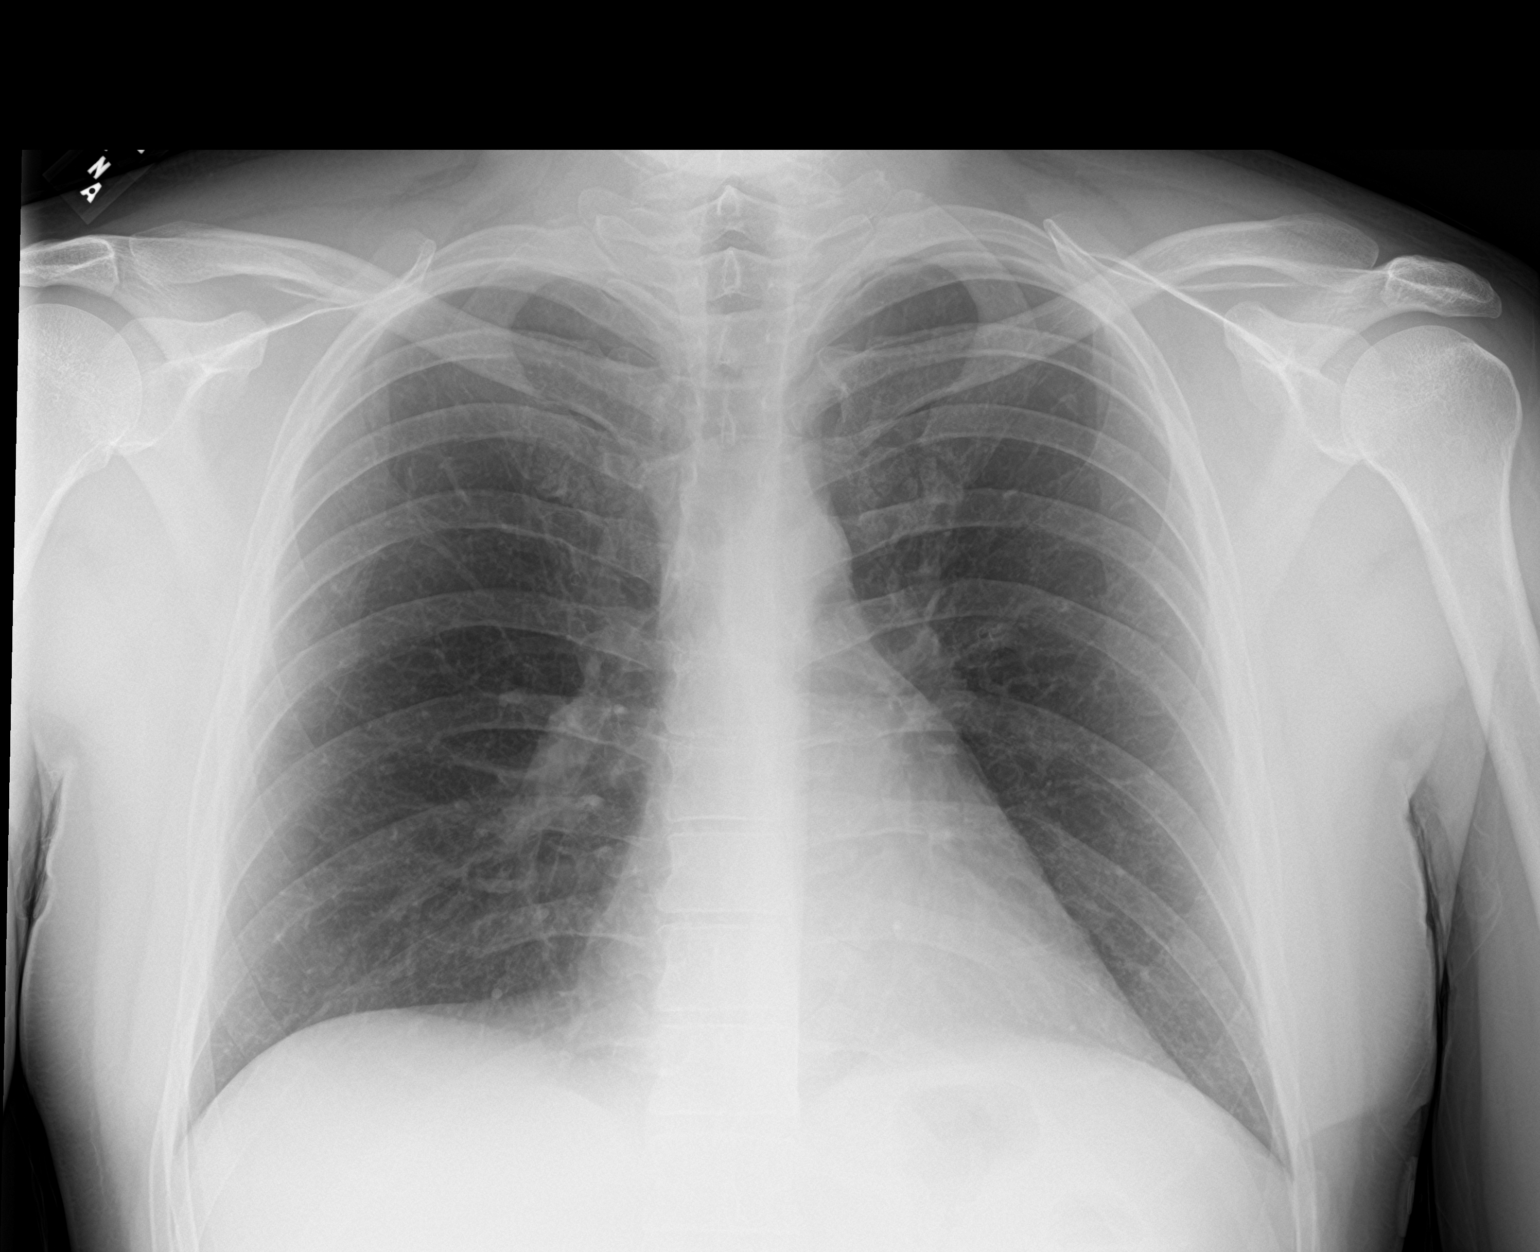

[2 of 2 positions shown; findings below may reference images not displayed]

FINDINGS: Mediastinum and hilar structures normal. Lungs are clear. No pleural
effusion or pneumothorax. Heart size normal. No acute bony
abnormality.
IMPRESSION: Chest is stable from prior exam.

## 2020-06-12 IMAGING — US US ABDOMEN LIMITED
2 series · 14 of 25 positions shown · non-contrast
Comparison: None.

CLINICAL DATA: Upper abdominal pain for 1 day.

EXAM:
ULTRASOUND ABDOMEN LIMITED RIGHT UPPER QUADRANT

[Series 1: us abdomen limited · 13 of 37 slices shown (1 of 2)]
[im 1/37]
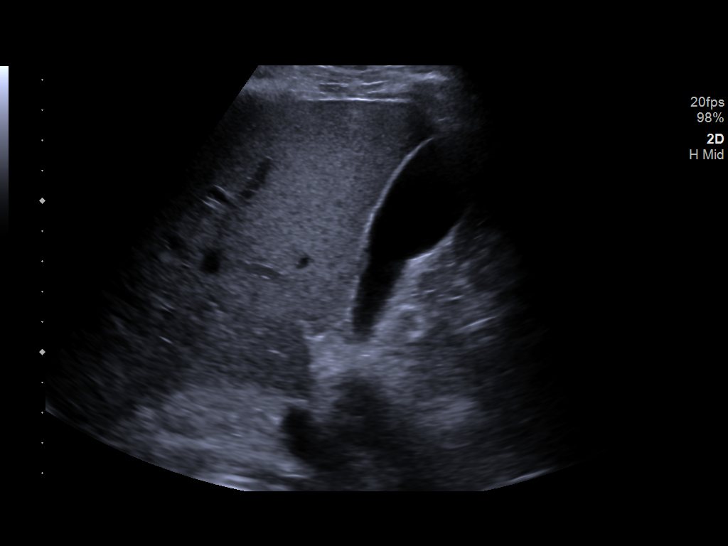
[im 4/37]
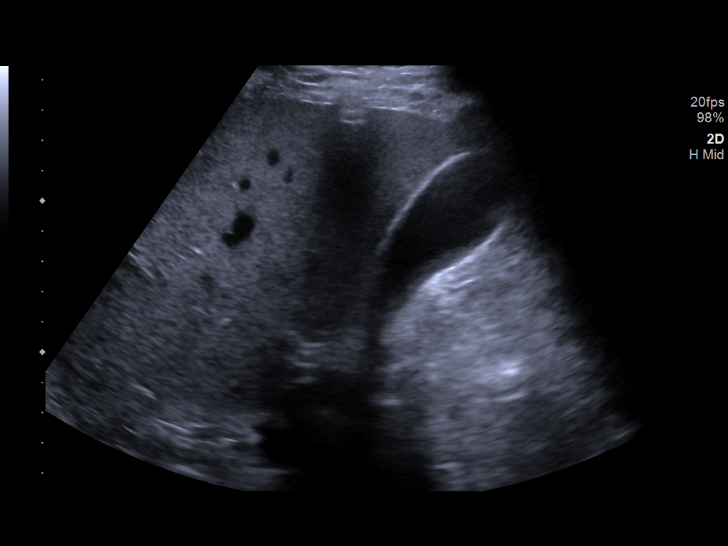
[im 7/37]
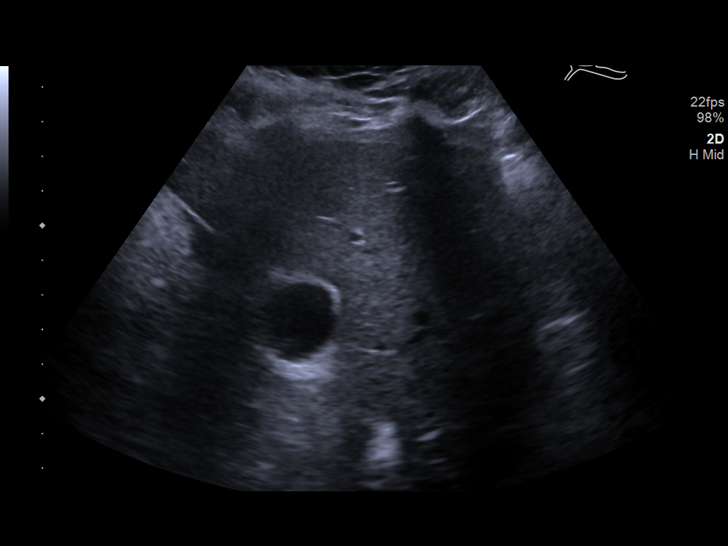
[im 10/37]
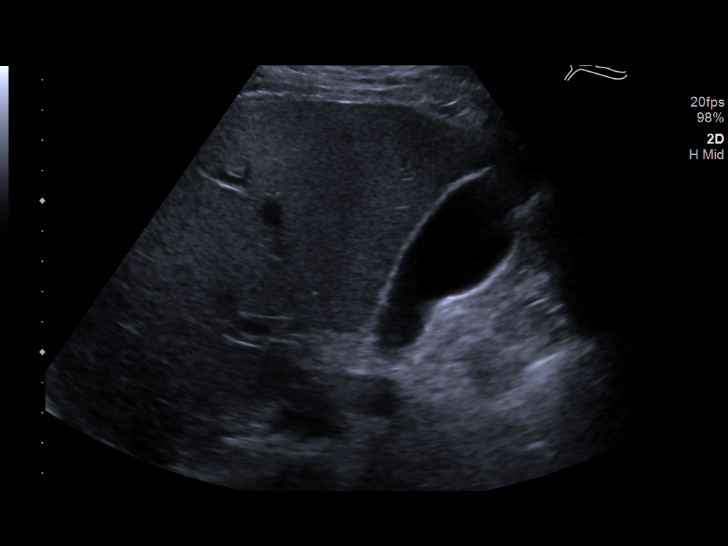
[im 14/37]
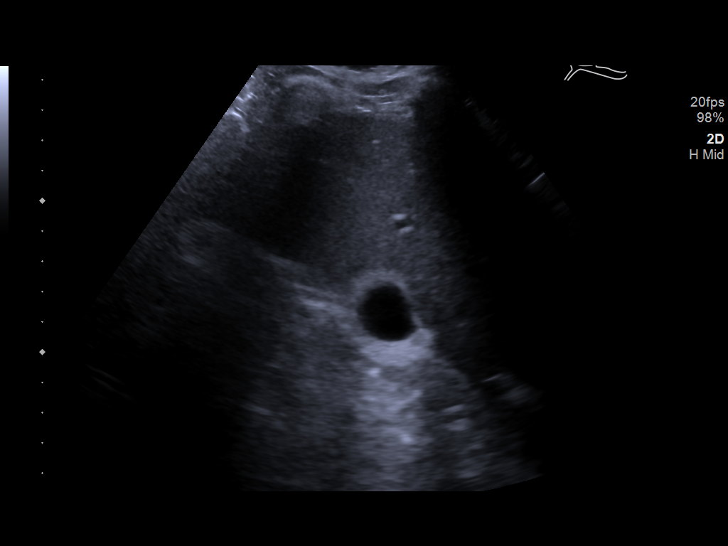
[im 15/37]
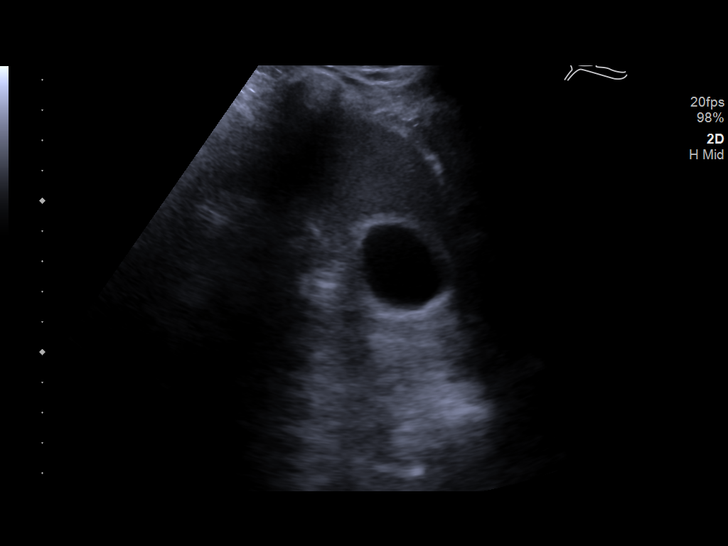
[im 19/37]
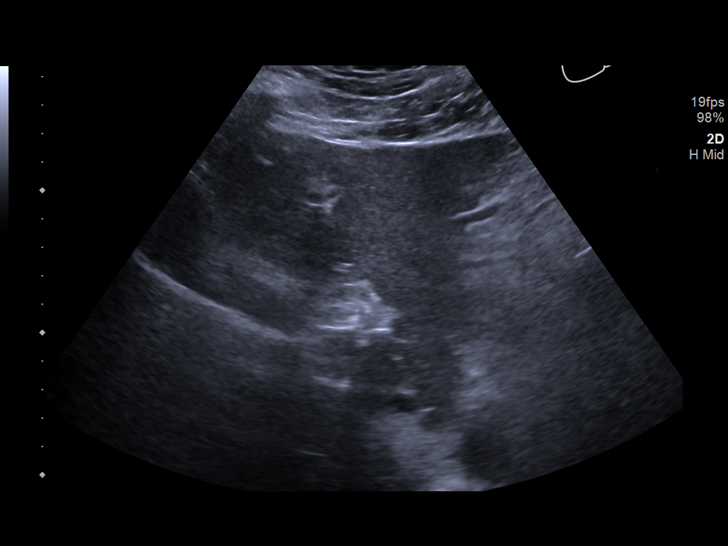
[im 22/37]
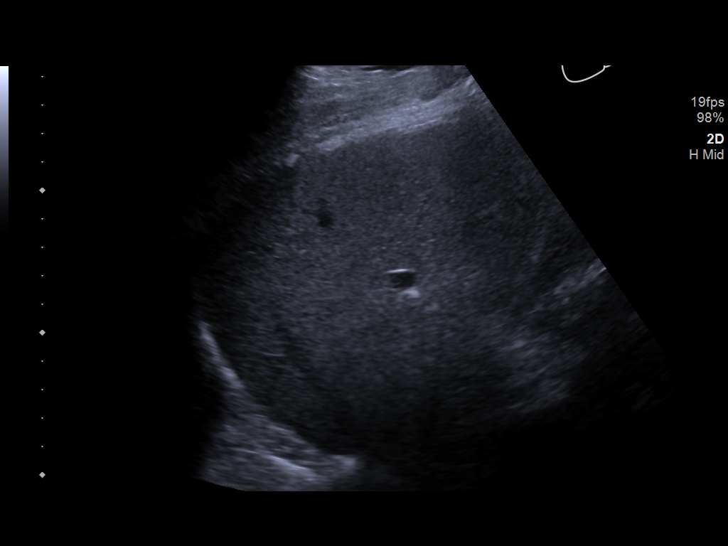
[im 25/37]
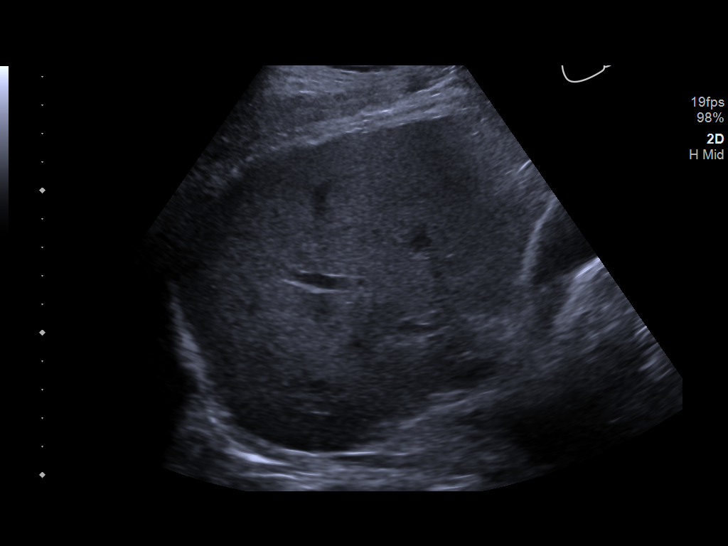
[im 27/37]
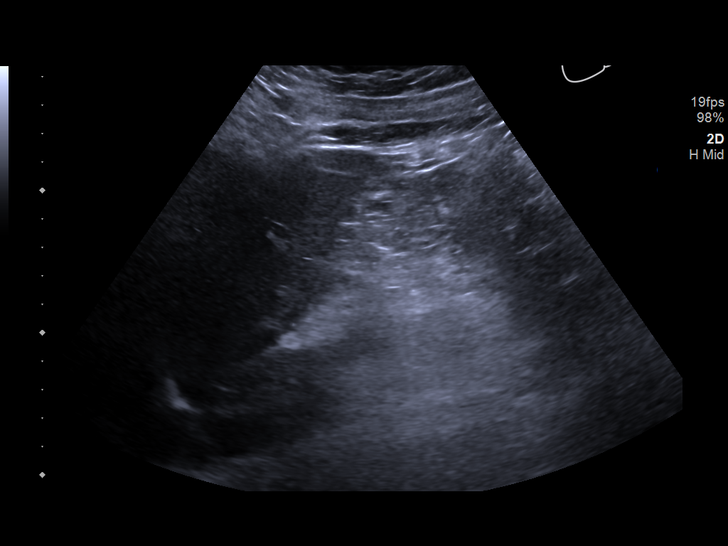
[im 30/37]
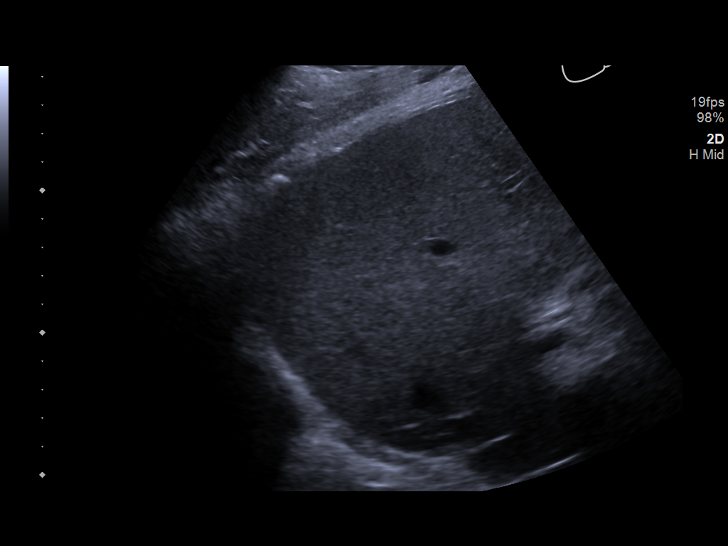
[im 33/37]
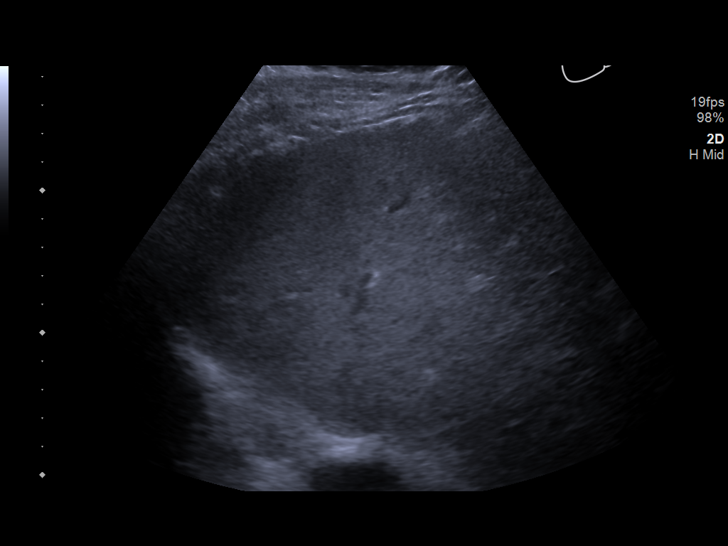
[im 37/37]
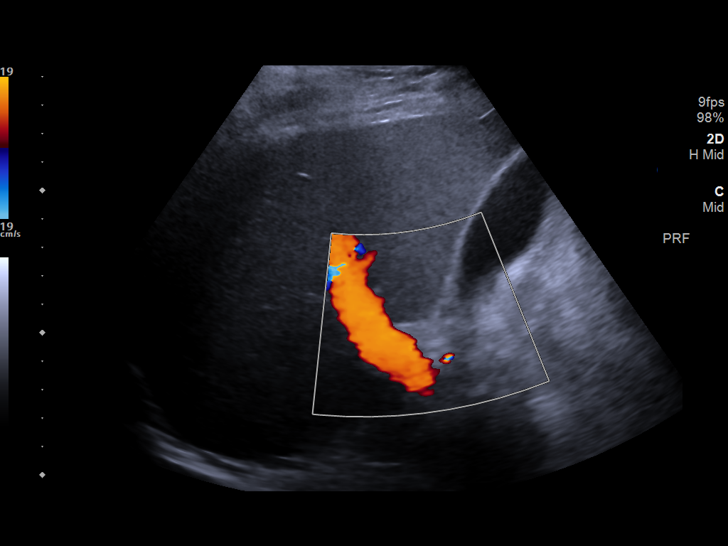

[Series 2: us abdomen limited · 1 of 3 slices shown (2 of 2)]
[im 3/3]
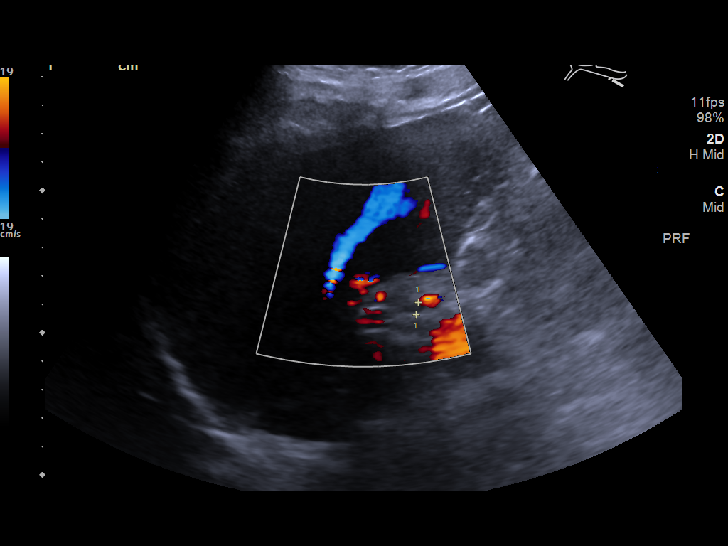

[14 of 25 positions shown; findings below may reference images not displayed]

FINDINGS: Gallbladder:

No gallstones or wall thickening visualized. No sonographic Murphy
sign noted by sonographer.

Common bile duct:

Diameter: 4.3 mm

Liver:

No focal lesion identified. Within normal limits in parenchymal
echogenicity. Portal vein is patent on color Doppler imaging with
normal direction of blood flow towards the liver.
IMPRESSION: Normal examination.

## 2020-06-12 IMAGING — CT CT ABD-PELV W/ CM
2 of 4 series · 17 of 46 positions shown, 19 images · IV contrast (APPLIED)
Comparison: None.

CLINICAL DATA: Acute abdominal pain.

EXAM:
CT ABDOMEN AND PELVIS WITH CONTRAST
TECHNIQUE: Multidetector CT imaging of the abdomen and pelvis was performed
using the standard protocol following bolus administration of
intravenous contrast.
CONTRAST:  100mL OMNIPAQUE IOHEXOL 300 MG/ML  SOLN

[Series 3: abdomen 5.0 · axial · 0.83mm/px · z∈[-488,-48]mm · 14 of 98 slices shown, 16 images]
[im 5/98  soft-tissue]
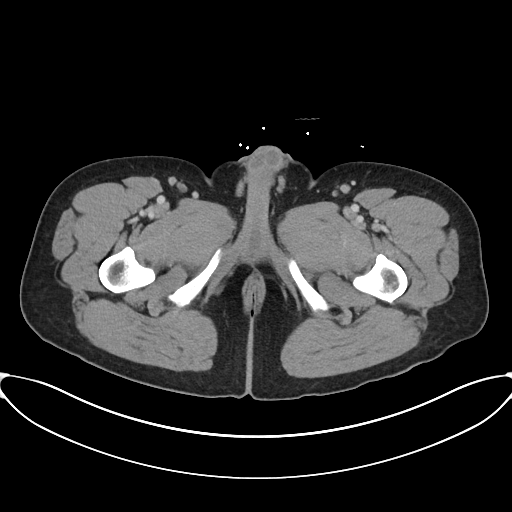
[im 5/98  bone]
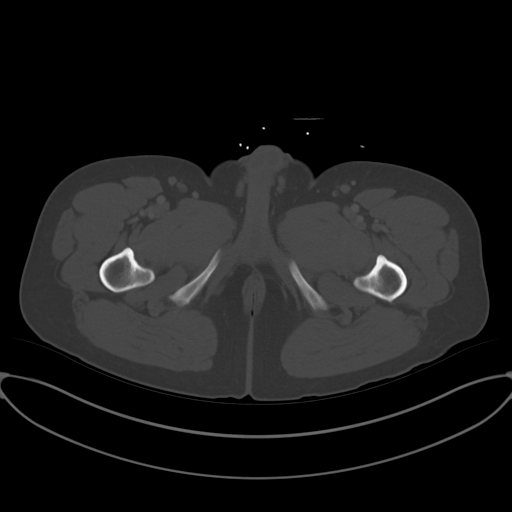
[im 15/98  soft-tissue]
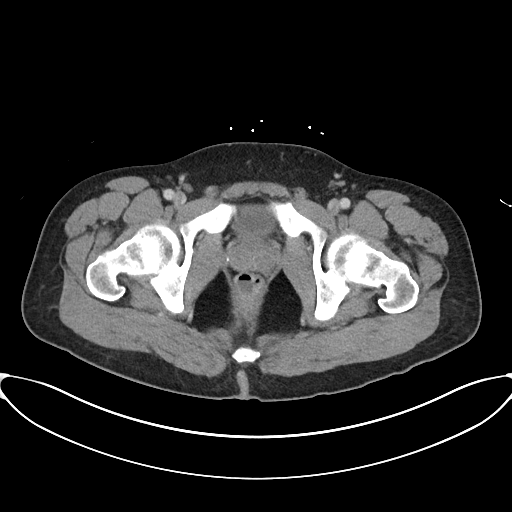
[im 20/98  soft-tissue]
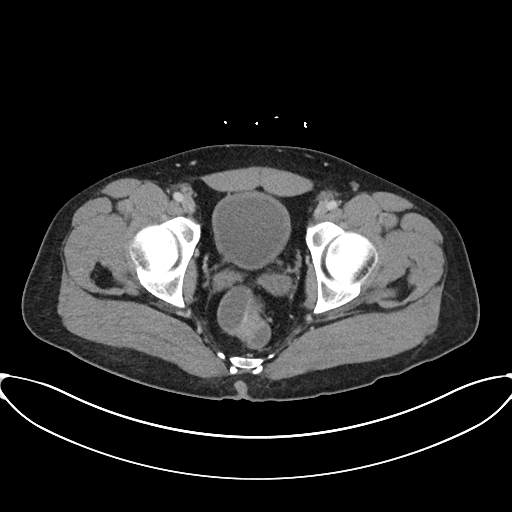
[im 25/98  soft-tissue]
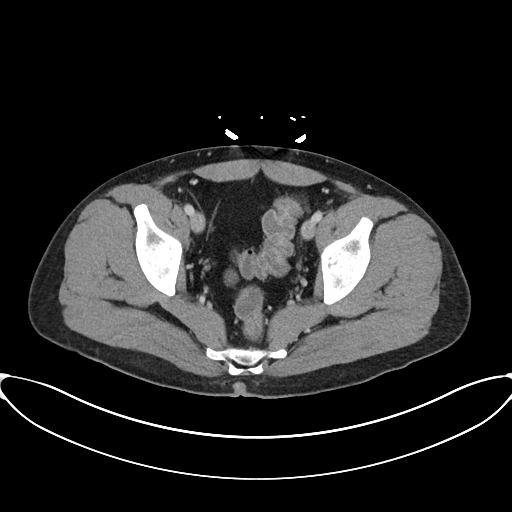
[im 34/98  soft-tissue]
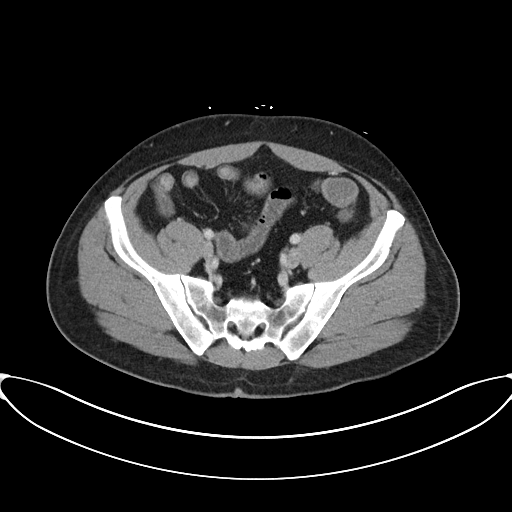
[im 39/98  soft-tissue]
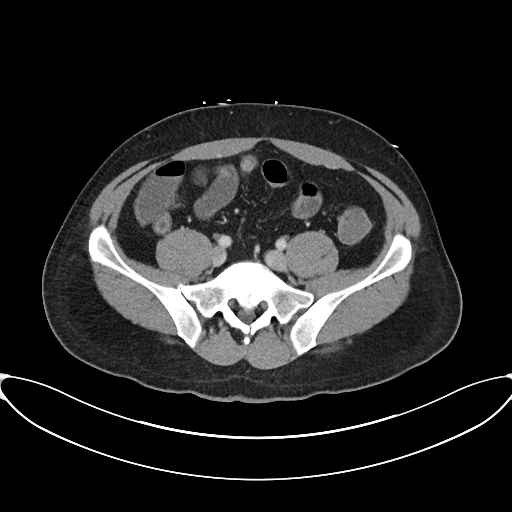
[im 44/98  soft-tissue]
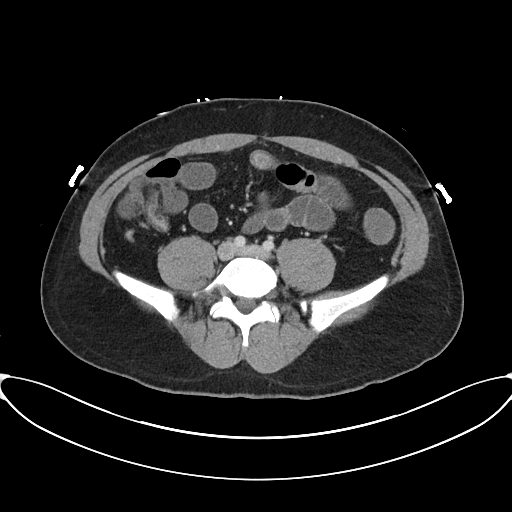
[im 54/98  soft-tissue]
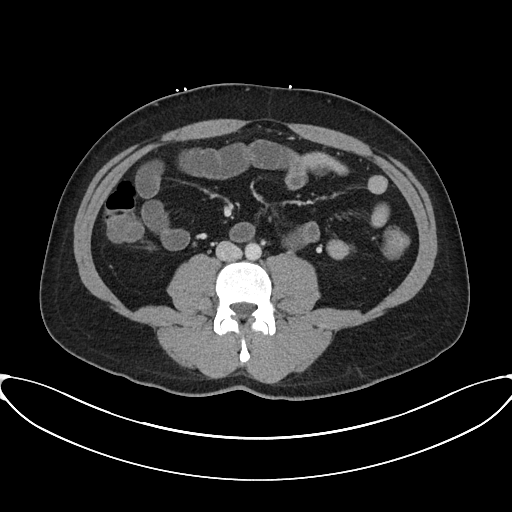
[im 59/98  soft-tissue]
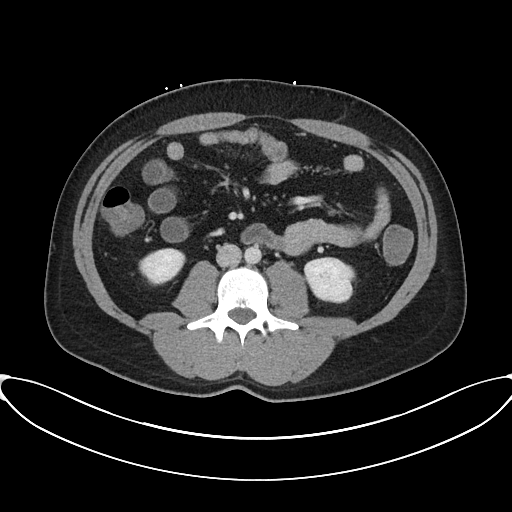
[im 59/98  bone]
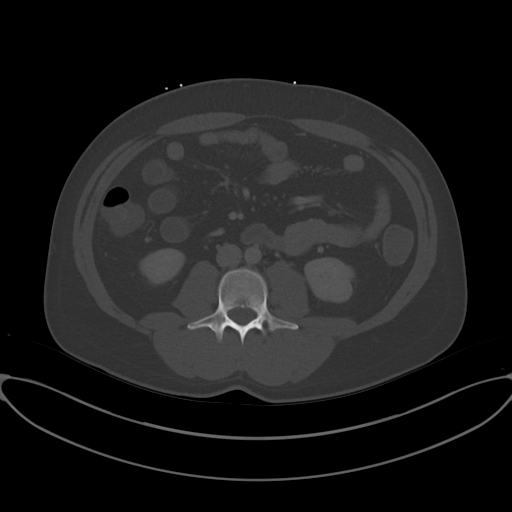
[im 64/98  soft-tissue]
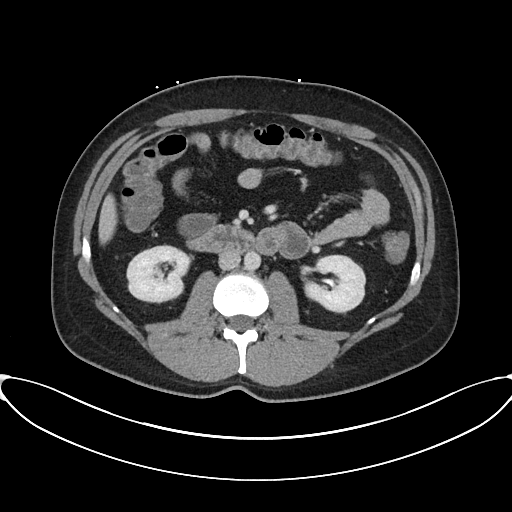
[im 73/98  soft-tissue]
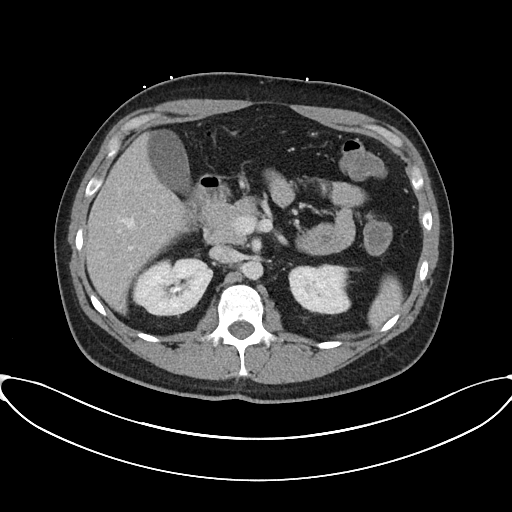
[im 78/98  soft-tissue]
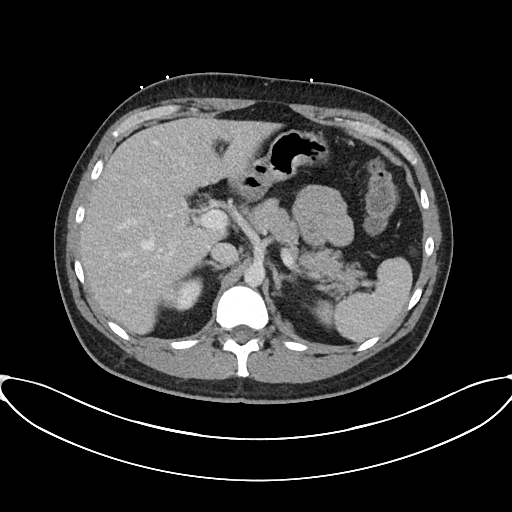
[im 83/98  soft-tissue]
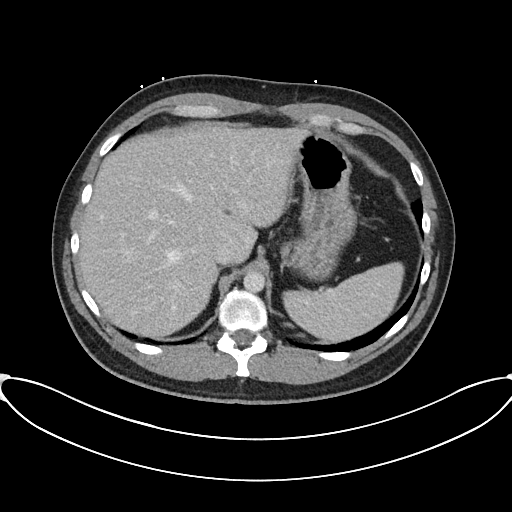
[im 93/98  soft-tissue]
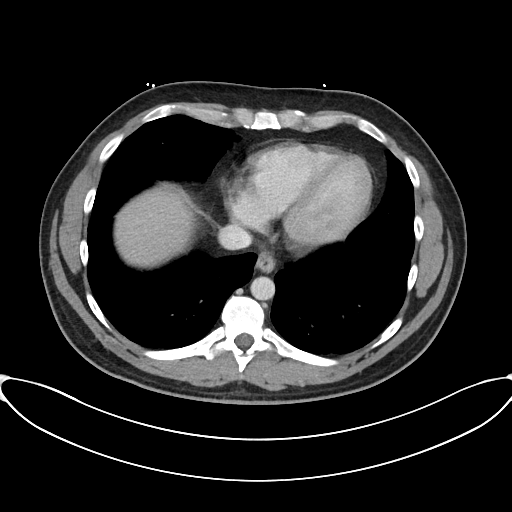

[Series 6: abdomen 3.0 mpr cor · coronal · 0.82mm/px · 3 of 100 slices shown]
[im 34/100  soft-tissue]
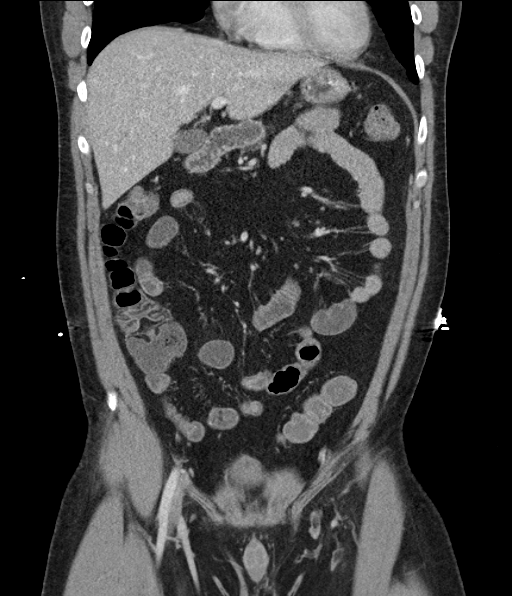
[im 45/100  soft-tissue]
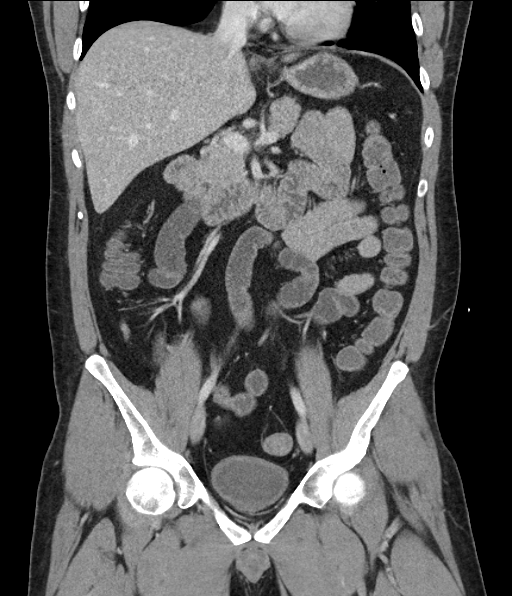
[im 56/100  soft-tissue]
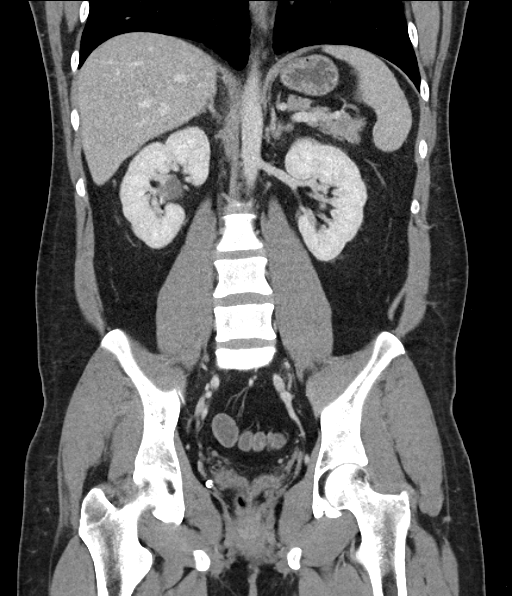

[17 of 46 positions shown; findings below may reference images not displayed]

FINDINGS: Lower chest: No acute abnormality.

Hepatobiliary: No focal liver abnormality is seen. No gallstones,
gallbladder wall thickening, or biliary dilatation.

Pancreas: Unremarkable. No pancreatic ductal dilatation or
surrounding inflammatory changes.

Spleen: Normal in size without focal abnormality.

Adrenals/Urinary Tract: Adrenal glands are unremarkable. Kidneys are
normal, without renal calculi, focal lesion, or hydronephrosis.
Bladder is unremarkable.

Stomach/Bowel: Stomach is within normal limits. Appendix appears
normal. No evidence of bowel wall thickening, distention, or
inflammatory changes.

Vascular/Lymphatic: No significant vascular findings are present. No
enlarged abdominal or pelvic lymph nodes.

Reproductive: Prostate is unremarkable.

Other: No abdominal wall hernia or abnormality. No abdominopelvic
ascites.

Musculoskeletal: No acute or significant osseous findings.
IMPRESSION: No abnormality seen in the abdomen or pelvis.

## 2021-10-24 DIAGNOSIS — R69 Illness, unspecified: Secondary | ICD-10-CM | POA: Diagnosis not present

## 2021-10-24 DIAGNOSIS — F3132 Bipolar disorder, current episode depressed, moderate: Secondary | ICD-10-CM | POA: Diagnosis not present

## 2021-10-26 DIAGNOSIS — F3132 Bipolar disorder, current episode depressed, moderate: Secondary | ICD-10-CM | POA: Diagnosis not present

## 2021-10-26 DIAGNOSIS — R69 Illness, unspecified: Secondary | ICD-10-CM | POA: Diagnosis not present

## 2021-10-31 DIAGNOSIS — R69 Illness, unspecified: Secondary | ICD-10-CM | POA: Diagnosis not present

## 2021-10-31 DIAGNOSIS — F3132 Bipolar disorder, current episode depressed, moderate: Secondary | ICD-10-CM | POA: Diagnosis not present

## 2021-11-01 DIAGNOSIS — Z565 Uncongenial work environment: Secondary | ICD-10-CM | POA: Diagnosis not present

## 2021-11-01 DIAGNOSIS — R69 Illness, unspecified: Secondary | ICD-10-CM | POA: Diagnosis not present

## 2021-11-01 DIAGNOSIS — F32A Depression, unspecified: Secondary | ICD-10-CM | POA: Diagnosis not present

## 2021-11-15 DIAGNOSIS — F3132 Bipolar disorder, current episode depressed, moderate: Secondary | ICD-10-CM | POA: Diagnosis not present

## 2021-11-15 DIAGNOSIS — R69 Illness, unspecified: Secondary | ICD-10-CM | POA: Diagnosis not present

## 2021-11-21 DIAGNOSIS — F3132 Bipolar disorder, current episode depressed, moderate: Secondary | ICD-10-CM | POA: Diagnosis not present

## 2021-11-21 DIAGNOSIS — R69 Illness, unspecified: Secondary | ICD-10-CM | POA: Diagnosis not present

## 2021-11-28 DIAGNOSIS — F3132 Bipolar disorder, current episode depressed, moderate: Secondary | ICD-10-CM | POA: Diagnosis not present

## 2021-11-28 DIAGNOSIS — R69 Illness, unspecified: Secondary | ICD-10-CM | POA: Diagnosis not present

## 2021-11-29 DIAGNOSIS — R69 Illness, unspecified: Secondary | ICD-10-CM | POA: Diagnosis not present

## 2021-11-29 DIAGNOSIS — Z565 Uncongenial work environment: Secondary | ICD-10-CM | POA: Diagnosis not present

## 2021-11-29 DIAGNOSIS — F32A Depression, unspecified: Secondary | ICD-10-CM | POA: Diagnosis not present

## 2021-12-05 DIAGNOSIS — F3132 Bipolar disorder, current episode depressed, moderate: Secondary | ICD-10-CM | POA: Diagnosis not present

## 2021-12-05 DIAGNOSIS — R69 Illness, unspecified: Secondary | ICD-10-CM | POA: Diagnosis not present

## 2021-12-12 DIAGNOSIS — R69 Illness, unspecified: Secondary | ICD-10-CM | POA: Diagnosis not present

## 2021-12-12 DIAGNOSIS — F3132 Bipolar disorder, current episode depressed, moderate: Secondary | ICD-10-CM | POA: Diagnosis not present

## 2021-12-19 DIAGNOSIS — R69 Illness, unspecified: Secondary | ICD-10-CM | POA: Diagnosis not present

## 2021-12-19 DIAGNOSIS — F3132 Bipolar disorder, current episode depressed, moderate: Secondary | ICD-10-CM | POA: Diagnosis not present

## 2021-12-26 DIAGNOSIS — R69 Illness, unspecified: Secondary | ICD-10-CM | POA: Diagnosis not present

## 2021-12-26 DIAGNOSIS — F3132 Bipolar disorder, current episode depressed, moderate: Secondary | ICD-10-CM | POA: Diagnosis not present

## 2022-01-09 DIAGNOSIS — R69 Illness, unspecified: Secondary | ICD-10-CM | POA: Diagnosis not present

## 2022-01-09 DIAGNOSIS — F3132 Bipolar disorder, current episode depressed, moderate: Secondary | ICD-10-CM | POA: Diagnosis not present

## 2022-01-13 DIAGNOSIS — F32A Depression, unspecified: Secondary | ICD-10-CM | POA: Diagnosis not present

## 2022-01-13 DIAGNOSIS — Z565 Uncongenial work environment: Secondary | ICD-10-CM | POA: Diagnosis not present

## 2022-01-13 DIAGNOSIS — R69 Illness, unspecified: Secondary | ICD-10-CM | POA: Diagnosis not present

## 2022-01-16 DIAGNOSIS — R69 Illness, unspecified: Secondary | ICD-10-CM | POA: Diagnosis not present

## 2022-01-16 DIAGNOSIS — F3132 Bipolar disorder, current episode depressed, moderate: Secondary | ICD-10-CM | POA: Diagnosis not present

## 2022-01-18 DIAGNOSIS — R69 Illness, unspecified: Secondary | ICD-10-CM | POA: Diagnosis not present

## 2022-01-18 DIAGNOSIS — Z565 Uncongenial work environment: Secondary | ICD-10-CM | POA: Diagnosis not present

## 2022-01-18 DIAGNOSIS — F32A Depression, unspecified: Secondary | ICD-10-CM | POA: Diagnosis not present

## 2022-01-30 DIAGNOSIS — F3132 Bipolar disorder, current episode depressed, moderate: Secondary | ICD-10-CM | POA: Diagnosis not present

## 2022-01-30 DIAGNOSIS — R69 Illness, unspecified: Secondary | ICD-10-CM | POA: Diagnosis not present

## 2022-02-06 DIAGNOSIS — R69 Illness, unspecified: Secondary | ICD-10-CM | POA: Diagnosis not present

## 2022-02-06 DIAGNOSIS — F3132 Bipolar disorder, current episode depressed, moderate: Secondary | ICD-10-CM | POA: Diagnosis not present

## 2022-02-13 DIAGNOSIS — R69 Illness, unspecified: Secondary | ICD-10-CM | POA: Diagnosis not present

## 2022-02-13 DIAGNOSIS — F3132 Bipolar disorder, current episode depressed, moderate: Secondary | ICD-10-CM | POA: Diagnosis not present

## 2022-02-27 DIAGNOSIS — F3132 Bipolar disorder, current episode depressed, moderate: Secondary | ICD-10-CM | POA: Diagnosis not present

## 2022-02-27 DIAGNOSIS — R69 Illness, unspecified: Secondary | ICD-10-CM | POA: Diagnosis not present

## 2022-03-06 DIAGNOSIS — F3132 Bipolar disorder, current episode depressed, moderate: Secondary | ICD-10-CM | POA: Diagnosis not present

## 2022-03-06 DIAGNOSIS — R69 Illness, unspecified: Secondary | ICD-10-CM | POA: Diagnosis not present

## 2022-04-03 DIAGNOSIS — R69 Illness, unspecified: Secondary | ICD-10-CM | POA: Diagnosis not present

## 2022-04-03 DIAGNOSIS — F3132 Bipolar disorder, current episode depressed, moderate: Secondary | ICD-10-CM | POA: Diagnosis not present

## 2022-04-17 DIAGNOSIS — F3132 Bipolar disorder, current episode depressed, moderate: Secondary | ICD-10-CM | POA: Diagnosis not present

## 2022-04-17 DIAGNOSIS — R69 Illness, unspecified: Secondary | ICD-10-CM | POA: Diagnosis not present

## 2022-05-01 DIAGNOSIS — F3132 Bipolar disorder, current episode depressed, moderate: Secondary | ICD-10-CM | POA: Diagnosis not present

## 2022-05-01 DIAGNOSIS — R69 Illness, unspecified: Secondary | ICD-10-CM | POA: Diagnosis not present

## 2022-05-08 DIAGNOSIS — F3132 Bipolar disorder, current episode depressed, moderate: Secondary | ICD-10-CM | POA: Diagnosis not present

## 2022-05-08 DIAGNOSIS — F4310 Post-traumatic stress disorder, unspecified: Secondary | ICD-10-CM | POA: Diagnosis not present

## 2022-05-15 DIAGNOSIS — F4310 Post-traumatic stress disorder, unspecified: Secondary | ICD-10-CM | POA: Diagnosis not present

## 2022-05-15 DIAGNOSIS — F3132 Bipolar disorder, current episode depressed, moderate: Secondary | ICD-10-CM | POA: Diagnosis not present

## 2022-05-17 DIAGNOSIS — F411 Generalized anxiety disorder: Secondary | ICD-10-CM | POA: Diagnosis not present

## 2022-05-22 DIAGNOSIS — F3132 Bipolar disorder, current episode depressed, moderate: Secondary | ICD-10-CM | POA: Diagnosis not present

## 2022-05-22 DIAGNOSIS — F4310 Post-traumatic stress disorder, unspecified: Secondary | ICD-10-CM | POA: Diagnosis not present

## 2022-05-29 DIAGNOSIS — F4310 Post-traumatic stress disorder, unspecified: Secondary | ICD-10-CM | POA: Diagnosis not present

## 2022-05-29 DIAGNOSIS — F3132 Bipolar disorder, current episode depressed, moderate: Secondary | ICD-10-CM | POA: Diagnosis not present

## 2022-06-05 DIAGNOSIS — F4310 Post-traumatic stress disorder, unspecified: Secondary | ICD-10-CM | POA: Diagnosis not present

## 2022-06-05 DIAGNOSIS — F3132 Bipolar disorder, current episode depressed, moderate: Secondary | ICD-10-CM | POA: Diagnosis not present

## 2022-06-12 DIAGNOSIS — F3132 Bipolar disorder, current episode depressed, moderate: Secondary | ICD-10-CM | POA: Diagnosis not present

## 2022-06-12 DIAGNOSIS — F4310 Post-traumatic stress disorder, unspecified: Secondary | ICD-10-CM | POA: Diagnosis not present

## 2022-06-19 DIAGNOSIS — F3132 Bipolar disorder, current episode depressed, moderate: Secondary | ICD-10-CM | POA: Diagnosis not present

## 2022-06-19 DIAGNOSIS — F4310 Post-traumatic stress disorder, unspecified: Secondary | ICD-10-CM | POA: Diagnosis not present

## 2022-07-03 DIAGNOSIS — F4310 Post-traumatic stress disorder, unspecified: Secondary | ICD-10-CM | POA: Diagnosis not present

## 2022-07-03 DIAGNOSIS — F3132 Bipolar disorder, current episode depressed, moderate: Secondary | ICD-10-CM | POA: Diagnosis not present

## 2022-07-10 DIAGNOSIS — F3132 Bipolar disorder, current episode depressed, moderate: Secondary | ICD-10-CM | POA: Diagnosis not present

## 2022-07-10 DIAGNOSIS — F4310 Post-traumatic stress disorder, unspecified: Secondary | ICD-10-CM | POA: Diagnosis not present

## 2022-07-17 DIAGNOSIS — F3132 Bipolar disorder, current episode depressed, moderate: Secondary | ICD-10-CM | POA: Diagnosis not present

## 2022-07-17 DIAGNOSIS — F4310 Post-traumatic stress disorder, unspecified: Secondary | ICD-10-CM | POA: Diagnosis not present

## 2022-07-24 DIAGNOSIS — F3132 Bipolar disorder, current episode depressed, moderate: Secondary | ICD-10-CM | POA: Diagnosis not present

## 2022-07-24 DIAGNOSIS — F4310 Post-traumatic stress disorder, unspecified: Secondary | ICD-10-CM | POA: Diagnosis not present

## 2022-08-07 DIAGNOSIS — F3132 Bipolar disorder, current episode depressed, moderate: Secondary | ICD-10-CM | POA: Diagnosis not present

## 2022-08-07 DIAGNOSIS — F4310 Post-traumatic stress disorder, unspecified: Secondary | ICD-10-CM | POA: Diagnosis not present

## 2022-08-08 DIAGNOSIS — M25511 Pain in right shoulder: Secondary | ICD-10-CM | POA: Diagnosis not present

## 2022-08-08 DIAGNOSIS — F411 Generalized anxiety disorder: Secondary | ICD-10-CM | POA: Diagnosis not present

## 2022-08-08 DIAGNOSIS — F319 Bipolar disorder, unspecified: Secondary | ICD-10-CM | POA: Diagnosis not present

## 2022-08-08 DIAGNOSIS — F4310 Post-traumatic stress disorder, unspecified: Secondary | ICD-10-CM | POA: Diagnosis not present

## 2022-08-14 DIAGNOSIS — F3132 Bipolar disorder, current episode depressed, moderate: Secondary | ICD-10-CM | POA: Diagnosis not present

## 2022-08-14 DIAGNOSIS — F4310 Post-traumatic stress disorder, unspecified: Secondary | ICD-10-CM | POA: Diagnosis not present

## 2022-08-21 DIAGNOSIS — F3132 Bipolar disorder, current episode depressed, moderate: Secondary | ICD-10-CM | POA: Diagnosis not present

## 2022-08-21 DIAGNOSIS — F4310 Post-traumatic stress disorder, unspecified: Secondary | ICD-10-CM | POA: Diagnosis not present

## 2022-08-28 DIAGNOSIS — F3132 Bipolar disorder, current episode depressed, moderate: Secondary | ICD-10-CM | POA: Diagnosis not present

## 2022-08-28 DIAGNOSIS — F4310 Post-traumatic stress disorder, unspecified: Secondary | ICD-10-CM | POA: Diagnosis not present

## 2022-09-04 DIAGNOSIS — F3132 Bipolar disorder, current episode depressed, moderate: Secondary | ICD-10-CM | POA: Diagnosis not present

## 2022-09-04 DIAGNOSIS — F4310 Post-traumatic stress disorder, unspecified: Secondary | ICD-10-CM | POA: Diagnosis not present

## 2022-09-11 DIAGNOSIS — F4310 Post-traumatic stress disorder, unspecified: Secondary | ICD-10-CM | POA: Diagnosis not present

## 2022-09-11 DIAGNOSIS — F3132 Bipolar disorder, current episode depressed, moderate: Secondary | ICD-10-CM | POA: Diagnosis not present

## 2022-09-18 DIAGNOSIS — F4310 Post-traumatic stress disorder, unspecified: Secondary | ICD-10-CM | POA: Diagnosis not present

## 2022-09-18 DIAGNOSIS — F3132 Bipolar disorder, current episode depressed, moderate: Secondary | ICD-10-CM | POA: Diagnosis not present

## 2022-09-25 DIAGNOSIS — F4310 Post-traumatic stress disorder, unspecified: Secondary | ICD-10-CM | POA: Diagnosis not present

## 2022-09-25 DIAGNOSIS — F3132 Bipolar disorder, current episode depressed, moderate: Secondary | ICD-10-CM | POA: Diagnosis not present

## 2022-10-02 DIAGNOSIS — F3132 Bipolar disorder, current episode depressed, moderate: Secondary | ICD-10-CM | POA: Diagnosis not present

## 2022-10-02 DIAGNOSIS — F4310 Post-traumatic stress disorder, unspecified: Secondary | ICD-10-CM | POA: Diagnosis not present

## 2022-10-09 DIAGNOSIS — F3132 Bipolar disorder, current episode depressed, moderate: Secondary | ICD-10-CM | POA: Diagnosis not present

## 2022-10-09 DIAGNOSIS — F4310 Post-traumatic stress disorder, unspecified: Secondary | ICD-10-CM | POA: Diagnosis not present

## 2022-10-16 DIAGNOSIS — F4310 Post-traumatic stress disorder, unspecified: Secondary | ICD-10-CM | POA: Diagnosis not present

## 2022-10-16 DIAGNOSIS — F3132 Bipolar disorder, current episode depressed, moderate: Secondary | ICD-10-CM | POA: Diagnosis not present

## 2022-10-23 DIAGNOSIS — F3132 Bipolar disorder, current episode depressed, moderate: Secondary | ICD-10-CM | POA: Diagnosis not present

## 2022-10-23 DIAGNOSIS — F4310 Post-traumatic stress disorder, unspecified: Secondary | ICD-10-CM | POA: Diagnosis not present

## 2022-11-06 DIAGNOSIS — F3132 Bipolar disorder, current episode depressed, moderate: Secondary | ICD-10-CM | POA: Diagnosis not present

## 2022-11-06 DIAGNOSIS — F4310 Post-traumatic stress disorder, unspecified: Secondary | ICD-10-CM | POA: Diagnosis not present

## 2022-11-13 DIAGNOSIS — F4310 Post-traumatic stress disorder, unspecified: Secondary | ICD-10-CM | POA: Diagnosis not present

## 2022-11-13 DIAGNOSIS — F3132 Bipolar disorder, current episode depressed, moderate: Secondary | ICD-10-CM | POA: Diagnosis not present

## 2022-11-20 DIAGNOSIS — F3132 Bipolar disorder, current episode depressed, moderate: Secondary | ICD-10-CM | POA: Diagnosis not present

## 2022-11-20 DIAGNOSIS — F4310 Post-traumatic stress disorder, unspecified: Secondary | ICD-10-CM | POA: Diagnosis not present

## 2022-11-27 DIAGNOSIS — F4310 Post-traumatic stress disorder, unspecified: Secondary | ICD-10-CM | POA: Diagnosis not present

## 2022-11-27 DIAGNOSIS — F3132 Bipolar disorder, current episode depressed, moderate: Secondary | ICD-10-CM | POA: Diagnosis not present

## 2022-12-04 DIAGNOSIS — F4310 Post-traumatic stress disorder, unspecified: Secondary | ICD-10-CM | POA: Diagnosis not present

## 2022-12-04 DIAGNOSIS — F3132 Bipolar disorder, current episode depressed, moderate: Secondary | ICD-10-CM | POA: Diagnosis not present

## 2022-12-11 DIAGNOSIS — F3132 Bipolar disorder, current episode depressed, moderate: Secondary | ICD-10-CM | POA: Diagnosis not present

## 2022-12-11 DIAGNOSIS — F4310 Post-traumatic stress disorder, unspecified: Secondary | ICD-10-CM | POA: Diagnosis not present

## 2022-12-18 DIAGNOSIS — F3132 Bipolar disorder, current episode depressed, moderate: Secondary | ICD-10-CM | POA: Diagnosis not present

## 2022-12-18 DIAGNOSIS — F4310 Post-traumatic stress disorder, unspecified: Secondary | ICD-10-CM | POA: Diagnosis not present

## 2022-12-25 DIAGNOSIS — F4310 Post-traumatic stress disorder, unspecified: Secondary | ICD-10-CM | POA: Diagnosis not present

## 2022-12-25 DIAGNOSIS — F3132 Bipolar disorder, current episode depressed, moderate: Secondary | ICD-10-CM | POA: Diagnosis not present

## 2023-01-08 DIAGNOSIS — F4310 Post-traumatic stress disorder, unspecified: Secondary | ICD-10-CM | POA: Diagnosis not present

## 2023-01-08 DIAGNOSIS — F3132 Bipolar disorder, current episode depressed, moderate: Secondary | ICD-10-CM | POA: Diagnosis not present

## 2023-01-15 DIAGNOSIS — F3132 Bipolar disorder, current episode depressed, moderate: Secondary | ICD-10-CM | POA: Diagnosis not present

## 2023-01-15 DIAGNOSIS — F4310 Post-traumatic stress disorder, unspecified: Secondary | ICD-10-CM | POA: Diagnosis not present

## 2023-01-22 DIAGNOSIS — F4310 Post-traumatic stress disorder, unspecified: Secondary | ICD-10-CM | POA: Diagnosis not present

## 2023-01-22 DIAGNOSIS — F3132 Bipolar disorder, current episode depressed, moderate: Secondary | ICD-10-CM | POA: Diagnosis not present

## 2023-01-29 DIAGNOSIS — F4310 Post-traumatic stress disorder, unspecified: Secondary | ICD-10-CM | POA: Diagnosis not present

## 2023-01-29 DIAGNOSIS — F3132 Bipolar disorder, current episode depressed, moderate: Secondary | ICD-10-CM | POA: Diagnosis not present

## 2023-02-05 DIAGNOSIS — F4310 Post-traumatic stress disorder, unspecified: Secondary | ICD-10-CM | POA: Diagnosis not present

## 2023-02-05 DIAGNOSIS — F3132 Bipolar disorder, current episode depressed, moderate: Secondary | ICD-10-CM | POA: Diagnosis not present

## 2023-02-12 DIAGNOSIS — F3132 Bipolar disorder, current episode depressed, moderate: Secondary | ICD-10-CM | POA: Diagnosis not present

## 2023-02-12 DIAGNOSIS — F4310 Post-traumatic stress disorder, unspecified: Secondary | ICD-10-CM | POA: Diagnosis not present

## 2023-02-19 DIAGNOSIS — F4310 Post-traumatic stress disorder, unspecified: Secondary | ICD-10-CM | POA: Diagnosis not present

## 2023-02-19 DIAGNOSIS — F3132 Bipolar disorder, current episode depressed, moderate: Secondary | ICD-10-CM | POA: Diagnosis not present

## 2023-02-25 ENCOUNTER — Other Ambulatory Visit: Payer: Self-pay

## 2023-02-25 ENCOUNTER — Encounter (HOSPITAL_COMMUNITY): Payer: Self-pay | Admitting: *Deleted

## 2023-02-25 ENCOUNTER — Ambulatory Visit (HOSPITAL_COMMUNITY)
Admission: EM | Admit: 2023-02-25 | Discharge: 2023-02-25 | Disposition: A | Discharge status: Home / Self Care | Payer: 59 | Attending: Internal Medicine | Admitting: Internal Medicine

## 2023-02-25 DIAGNOSIS — N611 Abscess of the breast and nipple: Secondary | ICD-10-CM | POA: Diagnosis not present

## 2023-02-25 MED ORDER — AMOXICILLIN-POT CLAVULANATE 875-125 MG PO TABS: 1.0000 | ORAL_TABLET | Freq: Two times a day (BID) | ORAL | 0 refills | Status: DC

## 2023-02-25 NOTE — ED Triage Notes (Signed)
Pt reports he has pain around his Lt nipple . The skin around Lt nipple has a reash. Pt also ha shad chills with no engery.

## 2023-02-25 NOTE — ED Provider Notes (Signed)
[facility]    CSN: [id number] Arrival date & time: 02/25/23  1020      History   Chief Complaint Chief Complaint  Patient presents with   Skin Problem    HPI Ryan Hays is a 37 y.o. male.   37 year old male who presents to urgent care with complaints of left nipple pain and rash along with feeling like he has no energy.  He also reports having chills. This started about a week ago. The symptoms seem to be getting worse. Tried to use hydrocortisone OTC but no improvement. Has been taking ibuprofen as well. Also has a very small amount of nausea. Still able to eat and drink normal.      History reviewed. No pertinent past medical history.  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 Authorizing Provider  albuterol (PROVENTIL HFA;VENTOLIN HFA) 108 (90 BASE) MCG/ACT inhaler Take 2 inhalations about 4 times daily if needed for wheezing or coughing Patient not taking: Reported on 08/24/2014 03/22/13   Peyton Najjar, MD  famotidine (PEPCID) 20 MG tablet Take 1 tablet (20 mg total) by mouth 2 (two) times daily. 04/23/18   Loren Racer, MD  mupirocin ointment Idelle Jo) 2 % Use twice daily on blisters that rupture 08/24/14   Peyton Najjar, MD  ondansetron (ZOFRAN) 4 MG tablet Take 1 tablet (4 mg total) by mouth every 6 (six) hours as needed for nausea or vomiting. 04/23/18   Loren Racer, MD    Family History Family History  Problem Relation Age of Onset   Hyperlipidemia Mother    Mental illness Mother    Diabetes Maternal Grandmother    Heart disease Maternal  Grandfather    Lung cancer Paternal Grandmother     Social History Social History   Tobacco Use   Smoking status: Never   Smokeless tobacco: Never  Substance Use Topics   Alcohol use: Yes    Alcohol/week: 1.0 standard drink of alcohol    Types: 1 Cans of beer per week    Comment: 2 per week   Drug use: No     Allergies   Molds & smuts   Review of Systems Review of Systems  Constitutional:  Negative for chills and fever.  HENT:  Negative for ear pain and sore throat.   Eyes:  Negative for pain and visual disturbance.  Respiratory:  Negative for cough and shortness of breath.   Cardiovascular:  Negative for chest pain and palpitations.  Gastrointestinal:  Negative for abdominal pain and vomiting.  Genitourinary:  Negative for dysuria and hematuria.  Musculoskeletal:  Negative for arthralgias and back pain.  Skin:  Positive for rash (Around left nipple). Negative for color change.  Neurological:  Negative for seizures and syncope.  All other systems reviewed and are negative.    Physical Exam Triage Vital Signs ED Triage Vitals  Encounter Vitals Group     BP 02/25/23 1126 118/82     Systolic BP Percentile --      Diastolic BP Percentile --      Pulse Rate 02/25/23 1126 77  Resp 02/25/23 1126 20     Temp 02/25/23 1126 98.3 F (36.8 C)     Temp src --      SpO2 02/25/23 1126 95 %     Weight --      Height --      Head Circumference --      Peak Flow --      Pain Score 02/25/23 1124 4     Pain Loc --      Pain Education --      Exclude from Growth Chart --    No data found.  Updated Vital Signs BP 118/82   Pulse 77   Temp 98.3 F (36.8 C)   Resp 20   SpO2 95%   Visual Acuity Right Eye Distance:   Left Eye Distance:   Bilateral Distance:    Right Eye Near:   Left Eye Near:    Bilateral Near:     Physical Exam Vitals and nursing note reviewed.  Constitutional:      General: He is not in acute distress.    Appearance: He is well-developed.   HENT:     Head: Normocephalic and atraumatic.  Eyes:     Conjunctiva/sclera: Conjunctivae normal.  Cardiovascular:     Rate and Rhythm: Normal rate and regular rhythm.     Heart sounds: No murmur heard. Pulmonary:     Effort: Pulmonary effort is normal. No respiratory distress.     Breath sounds: Normal breath sounds.  Chest:    Abdominal:     Palpations: Abdomen is soft.     Tenderness: There is no abdominal tenderness.  Musculoskeletal:        General: No swelling.     Cervical back: Neck supple.  Skin:    General: Skin is warm and dry.     Capillary Refill: Capillary refill takes less than 2 seconds.  Neurological:     Mental Status: He is alert.  Psychiatric:        Mood and Affect: Mood normal.      UC Treatments / Results  Labs (all labs ordered are listed, but only abnormal results are displayed) Labs Reviewed - No data to display  EKG   Radiology No results found.  Procedures Procedures (including critical care time)  Medications Ordered in UC Medications - No data to display  Initial Impression / Assessment and Plan / UC Course  I have reviewed the triage vital signs and the nursing notes.  Pertinent labs & imaging results that were available during my care of the patient were reviewed by me and considered in my medical decision making (see chart for details).     Left breast abscess   Left breast abscess without apparent fluid collection. This is causing the pain and likely the fatigue. This can be treated initially with antibiotics by mouth. Will treat with the following:  Augmentin 875mg  twice daily for 7 days Monitor for any worsening. If no improvement in 3 days, then return to urgent care.  Return if any worsening. Ibuprofen as needed for pain  Final Clinical Impressions(s) / UC Diagnoses   Final diagnoses:  None   Discharge Instructions   None    ED Prescriptions   None    PDMP not reviewed this encounter.   Landis Martins, [place] 02/25/23 1221

## 2023-02-25 NOTE — Discharge Instructions (Addendum)
Left breast abscess without apparent fluid collection. This is causing the pain and likely the fatigue. This can be treated initially with antibiotics by mouth. Will treat with the following:  Augmentin 875mg  twice daily for 7 days. Monitor for any worsening. If no improvement in 3 days, then return to urgent care.  Return if any worsening. Ok to alternate heat and ice during the day to help with symptoms.  Ibuprofen as needed for pain.

## 2023-02-26 DIAGNOSIS — F4310 Post-traumatic stress disorder, unspecified: Secondary | ICD-10-CM | POA: Diagnosis not present

## 2023-02-26 DIAGNOSIS — F3132 Bipolar disorder, current episode depressed, moderate: Secondary | ICD-10-CM | POA: Diagnosis not present

## 2023-03-05 DIAGNOSIS — F3132 Bipolar disorder, current episode depressed, moderate: Secondary | ICD-10-CM | POA: Diagnosis not present

## 2023-03-05 DIAGNOSIS — F4310 Post-traumatic stress disorder, unspecified: Secondary | ICD-10-CM | POA: Diagnosis not present

## 2023-03-12 DIAGNOSIS — F4310 Post-traumatic stress disorder, unspecified: Secondary | ICD-10-CM | POA: Diagnosis not present

## 2023-03-12 DIAGNOSIS — F3132 Bipolar disorder, current episode depressed, moderate: Secondary | ICD-10-CM | POA: Diagnosis not present

## 2023-03-20 DIAGNOSIS — F3132 Bipolar disorder, current episode depressed, moderate: Secondary | ICD-10-CM | POA: Diagnosis not present

## 2023-03-20 DIAGNOSIS — F4310 Post-traumatic stress disorder, unspecified: Secondary | ICD-10-CM | POA: Diagnosis not present

## 2024-02-07 ENCOUNTER — Ambulatory Visit (HOSPITAL_COMMUNITY)
Admission: EM | Admit: 2024-02-07 | Discharge: 2024-02-07 | Disposition: A | Discharge status: Home / Self Care | Attending: Family Medicine | Admitting: Family Medicine

## 2024-02-07 ENCOUNTER — Encounter (HOSPITAL_COMMUNITY): Payer: Self-pay | Admitting: *Deleted

## 2024-02-07 ENCOUNTER — Other Ambulatory Visit: Payer: Self-pay

## 2024-02-07 DIAGNOSIS — R3589 Other polyuria: Secondary | ICD-10-CM | POA: Diagnosis present

## 2024-02-07 DIAGNOSIS — R300 Dysuria: Secondary | ICD-10-CM | POA: Diagnosis present

## 2024-02-07 DIAGNOSIS — F419 Anxiety disorder, unspecified: Secondary | ICD-10-CM | POA: Diagnosis present

## 2024-02-07 HISTORY — DX: Post-traumatic stress disorder, unspecified: F43.10

## 2024-02-07 HISTORY — DX: Anxiety disorder, unspecified: F41.9

## 2024-02-07 LAB — POCT URINE DIPSTICK
Bilirubin, UA: NEGATIVE
Blood, UA: NEGATIVE
Glucose, UA: NEGATIVE mg/dL
Leukocytes, UA: NEGATIVE
Nitrite, UA: NEGATIVE
Spec Grav, UA: 1.025 (ref 1.010–1.025)
Urobilinogen, UA: 1 U/dL
pH, UA: 6.5 (ref 5.0–8.0)

## 2024-02-07 LAB — GLUCOSE, POCT (MANUAL RESULT ENTRY): POC Glucose: 95 mg/dL (ref 70–99)

## 2024-02-07 MED ORDER — SULFAMETHOXAZOLE-TRIMETHOPRIM 800-160 MG PO TABS
1.0000 | ORAL_TABLET | Freq: Two times a day (BID) | ORAL | 0 refills | Status: AC
Start: 2024-02-07 — End: 2024-02-14

## 2024-02-07 MED ORDER — SERTRALINE HCL 50 MG PO TABS: 50.0000 mg | ORAL_TABLET | Freq: Every day | ORAL | 0 refills | Status: AC

## 2024-02-07 NOTE — Discharge Instructions (Signed)
 You have had labs (urine culture) sent today. We will call you with any significant abnormalities or if there is need to begin or change treatment or pursue further follow up.  You may also review your test results online through MyChart. If you do not have a MyChart account, instructions to sign up should be on your discharge paperwork.

## 2024-02-07 NOTE — ED Triage Notes (Addendum)
 C/O dysuria and urinary urgency with polyuria onset approx 4 days ago. Denies fevers. C/O poor energy. Denies hematuria or penile discharge. Pt is married and states he does not have any concerns for STIs. Also requesting sertraline  refill as his PCP moved.

## 2024-02-07 NOTE — ED Provider Notes (Signed)
 [facility]    ASSESSMENT & PLAN:  1. Dysuria   2. Polyuria   3. Anxiety    U/A fairly unremarkable but symptoms very suggestive or UTI. Will tx empirically. Urine culture sent. Refilled Zoloft at pt request.  Meds ordered this encounter  Medications   sertraline (ZOLOFT) 50 MG tablet    Sig: Take 1 tablet (50 mg total) by mouth daily.    Dispense:  90 tablet    Refill:  0   sulfamethoxazole-trimethoprim (BACTRIM DS) 800-160 MG tablet    Sig: Take 1 tablet by mouth 2 (two) times daily for 7 days.    Dispense:  14 tablet    Refill:  0    No signs of pyelonephritis. Ensure proper hydration. Will follow up with his PCP or here if not showing improvement over the next 48 hours, sooner if needed.  Outlined signs and symptoms indicating need for more acute intervention. Patient verbalized understanding. After Visit Summary given.  SUBJECTIVE:  Ryan Hays is a 38 y.o. male who complains of urinary frequency. C/O dysuria and urinary urgency with polyuria onset approx 4 days ago. Denies fevers. C/O poor energy. Denies hematuria or penile discharge. Pt is married and states he does not have any concerns for STIs. Also requesting sertraline refill as his PCP moved.   OBJECTIVE:  Vitals:   02/07/24 0839  BP: (!) 121/91  Pulse: (!) 105  Resp: 16  Temp: 98.4 F (36.9 C)  TempSrc: Oral  SpO2: 95%   General appearance: alert; no distress Psychological: alert and cooperative; normal mood and affect  Labs Reviewed  POCT URINE DIPSTICK - Abnormal; Notable for the following components:      Result Value   Clarity, UA cloudy (*)    Ketones, POC UA trace (5) (*)    All other components within normal limits  URINE CULTURE  GLUCOSE, POCT (MANUAL RESULT ENTRY)    Allergies  Allergen Reactions   Molds & Smuts Other (See Comments)    Positive allergy test    Past Medical History:  Diagnosis Date   Anxiety    PTSD (post-traumatic stress disorder)     Social History   Socioeconomic History   Marital status: Married    Spouse name: Not on file   Number of children: 2   Years of education: Not on file   Highest education level: Not on file  Occupational History   Occupation: MECHANICAL ENGINEER    Employer: [employer]  Tobacco Use   Smoking status: Never   Smokeless tobacco: Never  Vaping Use   Vaping status: Never Used  Substance and Sexual Activity   Alcohol use: Yes    Comment: occasionally   Drug use: No   Sexual activity: Yes    Birth control/protection: None  Other Topics Concern   Not on file  Social History Narrative   Not on file   Social Drivers of Health   Financial Resource Strain: Not on file  Food Insecurity: Not on file  Transportation Needs: Not on file  Physical Activity: Not on file  Stress: Not on file  Social Connections: Not on file  Intimate Partner Violence: Not on file   Family History  Problem Relation Age of Onset   Hyperlipidemia Mother    Mental illness Mother    Diabetes Maternal Grandmother    Heart disease Maternal Grandfather    Lung cancer Paternal Jeannine Rolinda Rogue, MD 02/07/24 [phone]

## 2024-02-08 ENCOUNTER — Ambulatory Visit (HOSPITAL_COMMUNITY): Payer: Self-pay

## 2024-02-08 LAB — URINE CULTURE: Culture: NO GROWTH
